# Patient Record
Sex: Female | Born: 1980 | Race: Black or African American | Hispanic: No | Smoking: Former smoker
Health system: Southern US, Community
[De-identification: ages and names within clinical notes are randomized; demographics above are authoritative.]

## PROBLEM LIST (undated history)

## (undated) DIAGNOSIS — F329 Major depressive disorder, single episode, unspecified: Secondary | ICD-10-CM

## (undated) DIAGNOSIS — F32A Depression, unspecified: Secondary | ICD-10-CM

## (undated) DIAGNOSIS — B009 Herpesviral infection, unspecified: Secondary | ICD-10-CM

## (undated) DIAGNOSIS — F909 Attention-deficit hyperactivity disorder, unspecified type: Secondary | ICD-10-CM

## (undated) DIAGNOSIS — J45909 Unspecified asthma, uncomplicated: Secondary | ICD-10-CM

## (undated) DIAGNOSIS — I341 Nonrheumatic mitral (valve) prolapse: Secondary | ICD-10-CM

## (undated) HISTORY — PX: OTHER SURGICAL HISTORY: SHX169

## (undated) HISTORY — DX: Unspecified asthma, uncomplicated: J45.909

## (undated) HISTORY — PX: CYSTECTOMY: SUR359

## (undated) HISTORY — DX: Nonrheumatic mitral (valve) prolapse: I34.1

---

## 2006-11-29 ENCOUNTER — Ambulatory Visit: Payer: Self-pay | Admitting: Obstetrics and Gynecology

## 2006-11-29 ENCOUNTER — Inpatient Hospital Stay (HOSPITAL_COMMUNITY): Admission: AD | Admit: 2006-11-29 | Discharge: 2006-11-29 | Payer: Self-pay | Admitting: Obstetrics & Gynecology

## 2006-12-14 ENCOUNTER — Ambulatory Visit: Payer: Self-pay | Admitting: Obstetrics and Gynecology

## 2006-12-14 ENCOUNTER — Inpatient Hospital Stay (HOSPITAL_COMMUNITY): Admission: AD | Admit: 2006-12-14 | Discharge: 2006-12-14 | Payer: Self-pay | Admitting: Family Medicine

## 2006-12-26 ENCOUNTER — Emergency Department (HOSPITAL_COMMUNITY): Admission: EM | Admit: 2006-12-26 | Discharge: 2006-12-26 | Payer: Self-pay | Admitting: Emergency Medicine

## 2007-01-01 ENCOUNTER — Inpatient Hospital Stay (HOSPITAL_COMMUNITY): Admission: AD | Admit: 2007-01-01 | Discharge: 2007-01-01 | Payer: Self-pay | Admitting: Obstetrics & Gynecology

## 2007-01-01 ENCOUNTER — Ambulatory Visit: Payer: Self-pay | Admitting: Obstetrics and Gynecology

## 2007-01-19 ENCOUNTER — Inpatient Hospital Stay (HOSPITAL_COMMUNITY): Admission: AD | Admit: 2007-01-19 | Discharge: 2007-01-19 | Payer: Self-pay | Admitting: Obstetrics & Gynecology

## 2007-01-19 ENCOUNTER — Ambulatory Visit: Payer: Self-pay | Admitting: Obstetrics and Gynecology

## 2007-01-24 ENCOUNTER — Ambulatory Visit: Payer: Self-pay | Admitting: Family Medicine

## 2007-01-24 ENCOUNTER — Inpatient Hospital Stay (HOSPITAL_COMMUNITY): Admission: AD | Admit: 2007-01-24 | Discharge: 2007-01-26 | Payer: Self-pay | Admitting: Family Medicine

## 2007-03-25 ENCOUNTER — Ambulatory Visit: Payer: Self-pay | Admitting: Obstetrics and Gynecology

## 2007-07-18 ENCOUNTER — Inpatient Hospital Stay (HOSPITAL_COMMUNITY): Admission: AD | Admit: 2007-07-18 | Discharge: 2007-07-18 | Payer: Self-pay | Admitting: Obstetrics & Gynecology

## 2007-07-20 ENCOUNTER — Inpatient Hospital Stay (HOSPITAL_COMMUNITY): Admission: AD | Admit: 2007-07-20 | Discharge: 2007-07-20 | Payer: Self-pay | Admitting: Obstetrics & Gynecology

## 2007-07-29 ENCOUNTER — Inpatient Hospital Stay (HOSPITAL_COMMUNITY): Admission: AD | Admit: 2007-07-29 | Discharge: 2007-07-29 | Payer: Self-pay | Admitting: Obstetrics & Gynecology

## 2007-09-03 ENCOUNTER — Emergency Department (HOSPITAL_COMMUNITY): Admission: EM | Admit: 2007-09-03 | Discharge: 2007-09-03 | Payer: Self-pay | Admitting: Emergency Medicine

## 2007-11-14 ENCOUNTER — Inpatient Hospital Stay (HOSPITAL_COMMUNITY): Admission: AD | Admit: 2007-11-14 | Discharge: 2007-11-14 | Payer: Self-pay | Admitting: Obstetrics & Gynecology

## 2008-10-28 DIAGNOSIS — I341 Nonrheumatic mitral (valve) prolapse: Secondary | ICD-10-CM | POA: Insufficient documentation

## 2008-10-28 HISTORY — DX: Nonrheumatic mitral (valve) prolapse: I34.1

## 2009-07-07 ENCOUNTER — Emergency Department (HOSPITAL_COMMUNITY): Admission: EM | Admit: 2009-07-07 | Discharge: 2009-07-07 | Payer: Self-pay | Admitting: Emergency Medicine

## 2009-10-16 DIAGNOSIS — A6000 Herpesviral infection of urogenital system, unspecified: Secondary | ICD-10-CM

## 2009-10-16 HISTORY — DX: Herpesviral infection of urogenital system, unspecified: A60.00

## 2010-05-07 ENCOUNTER — Emergency Department (HOSPITAL_COMMUNITY): Admission: EM | Admit: 2010-05-07 | Discharge: 2010-05-07 | Payer: Self-pay | Admitting: Family Medicine

## 2010-10-15 DIAGNOSIS — E559 Vitamin D deficiency, unspecified: Secondary | ICD-10-CM

## 2010-10-15 HISTORY — DX: Vitamin D deficiency, unspecified: E55.9

## 2010-11-18 ENCOUNTER — Encounter: Payer: Self-pay | Admitting: Orthopedic Surgery

## 2010-12-21 ENCOUNTER — Inpatient Hospital Stay (INDEPENDENT_AMBULATORY_CARE_PROVIDER_SITE_OTHER)
Admission: RE | Admit: 2010-12-21 | Discharge: 2010-12-21 | Disposition: A | Discharge status: Home / Self Care | Payer: Medicaid Other | Source: Ambulatory Visit | Attending: Family Medicine | Admitting: Family Medicine

## 2010-12-21 DIAGNOSIS — K089 Disorder of teeth and supporting structures, unspecified: Secondary | ICD-10-CM

## 2011-01-23 DIAGNOSIS — J309 Allergic rhinitis, unspecified: Secondary | ICD-10-CM

## 2011-01-23 DIAGNOSIS — J45909 Unspecified asthma, uncomplicated: Secondary | ICD-10-CM | POA: Insufficient documentation

## 2011-01-23 HISTORY — DX: Allergic rhinitis, unspecified: J30.9

## 2011-02-01 LAB — URINE MICROSCOPIC-ADD ON

## 2011-02-01 LAB — URINALYSIS, ROUTINE W REFLEX MICROSCOPIC
Bilirubin Urine: NEGATIVE
Glucose, UA: NEGATIVE mg/dL
Ketones, ur: 15 mg/dL — AB
Nitrite: NEGATIVE
pH: 7 (ref 5.0–8.0)

## 2011-02-01 LAB — POCT PREGNANCY, URINE: Preg Test, Ur: NEGATIVE

## 2011-02-27 DIAGNOSIS — F331 Major depressive disorder, recurrent, moderate: Secondary | ICD-10-CM | POA: Insufficient documentation

## 2011-02-27 HISTORY — DX: Major depressive disorder, recurrent, moderate: F33.1

## 2011-03-12 NOTE — Group Therapy Note (Signed)
NAME:  Natalie Colon, Natalie Colon NO.:  [id number]   MEDICAL RECORD NO.:  [id number]          PATIENT TYPE:  WOC   LOCATION:  [facility]                   FACILITY:  [facility]   PHYSICIAN:  Argentina Donovan, MD        DATE OF BIRTH:  1980-11-07   DATE OF SERVICE:                                  CLINIC NOTE   The patient is a 30 year old gravida 6, para 2-0-4-2 with 2 miscarriages  and 2 voluntary interruptions of pregnancy and two spontaneous vaginal  deliveries who desires voluntary sterilization.  We have talked to her  about the risks involved in  laparoscopic sterilization and told her it  is about the same as it would be she had a laparotomy, that is injury to  bowel, urinary tract, hemorrhage, blood vessel damage, infection,  anesthetic complications are all a possibility, also that the procedure  is not guaranteed 100%, that there are some who get pregnant afterwards.  It falls in the 1-2% category and that it should be considered permanent  as reconstitution of the tubes is difficult to do and to obtain.  We  have also discussed the alternatives for her, especially the IUD at 11  and 6-year.  She said she is a single mother who has had six pregnancies  by the age of  50.  She never wants any more kids.  She wants to do  something else with her life.  She is in nursing school at the present  time. She has always been in good health.  She has no significant  medical problems.  She has had no surgery other than the abortions.   ALLERGIES:  She has  allergies to VICODIN and PENICILLIN, though she  does not know what her allergies are as she was told by her parents she  is allergic to these two things.   MEDICATIONS:  She takes no medication on a regular basis.   PHYSICAL EXAMINATION:  She weighs 166 pounds.  She is 5 feet 7 inches  tall.  Review of systems is negative with exception of the present  illness.  The patient does not smoke, drink or take illicit drugs.  HEENT:   Within normal limits.  NECK:  Supple with no dominant masses in the thyroid which is  symmetrical LUNGS:  Clear to auscultation and percussion .  HEART:  No murmur, normal sinus rhythm.  ABDOMEN:  Soft, flat, nontender.  No masses or organomegaly.  EXTREMITIES:  No edema.  No varices.  DTRs within normal limits.  External genitalia is normal.  BUS is within normal limits.  Vagina is  clean and well rugated.  Cervix clean and parous.  Uterus anterior,  normal size, shape, consistency.  The adnexa is normal.   IMPRESSION:  The patient in good physical health desires voluntary  sterilization by the laparoscopic method.           ______________________________  Argentina Donovan, MD     PR/MEDQ  D:  03/25/2007  T:  03/25/2007  Job:  [id number]

## 2011-04-22 ENCOUNTER — Other Ambulatory Visit: Payer: Self-pay | Admitting: Obstetrics & Gynecology

## 2011-04-22 DIAGNOSIS — N971 Female infertility of tubal origin: Secondary | ICD-10-CM

## 2011-04-25 ENCOUNTER — Ambulatory Visit (HOSPITAL_COMMUNITY)
Admission: RE | Admit: 2011-04-25 | Discharge: 2011-04-25 | Disposition: A | Discharge status: Home / Self Care | Payer: Medicaid Other | Source: Ambulatory Visit | Attending: Obstetrics & Gynecology | Admitting: Obstetrics & Gynecology

## 2011-04-25 DIAGNOSIS — Z3049 Encounter for surveillance of other contraceptives: Secondary | ICD-10-CM | POA: Insufficient documentation

## 2011-04-25 DIAGNOSIS — N971 Female infertility of tubal origin: Secondary | ICD-10-CM

## 2011-04-25 MED ORDER — IOHEXOL 300 MG/ML  SOLN: 10.0000 mL | Freq: Once | INTRAMUSCULAR | Status: AC | PRN

## 2011-07-18 LAB — WET PREP, GENITAL
Trich, Wet Prep: NONE SEEN
Yeast Wet Prep HPF POC: NONE SEEN

## 2011-07-18 LAB — GC/CHLAMYDIA PROBE AMP, GENITAL
Chlamydia, DNA Probe: NEGATIVE
GC Probe Amp, Genital: NEGATIVE

## 2011-07-18 LAB — URINALYSIS, ROUTINE W REFLEX MICROSCOPIC
Bilirubin Urine: NEGATIVE
Glucose, UA: NEGATIVE
Hgb urine dipstick: NEGATIVE
Ketones, ur: NEGATIVE
Nitrite: NEGATIVE
Protein, ur: NEGATIVE
Specific Gravity, Urine: >1.03 — ABNORMAL HIGH
Urobilinogen, UA: 0.2
pH: 6

## 2011-07-18 LAB — POCT PREGNANCY, URINE
Operator id: 28886
Preg Test, Ur: POSITIVE

## 2011-07-22 DIAGNOSIS — F988 Other specified behavioral and emotional disorders with onset usually occurring in childhood and adolescence: Secondary | ICD-10-CM | POA: Insufficient documentation

## 2011-07-22 HISTORY — DX: Other specified behavioral and emotional disorders with onset usually occurring in childhood and adolescence: F98.8

## 2011-08-08 LAB — URINE MICROSCOPIC-ADD ON: WBC, UA: NONE SEEN

## 2011-08-08 LAB — URINALYSIS, ROUTINE W REFLEX MICROSCOPIC
Glucose, UA: NEGATIVE
Ketones, ur: 15 — AB
Leukocytes, UA: NEGATIVE
Specific Gravity, Urine: 1.025
pH: 6

## 2011-08-08 LAB — WET PREP, GENITAL
Clue Cells Wet Prep HPF POC: NONE SEEN
Yeast Wet Prep HPF POC: NONE SEEN

## 2011-08-08 LAB — CBC
HCT: 36.2
Hemoglobin: 12
MCHC: 33.2
RDW: 12.4
WBC: 13.7 — ABNORMAL HIGH

## 2011-08-08 LAB — POCT PREGNANCY, URINE: Operator id: 12753

## 2011-08-08 LAB — GC/CHLAMYDIA PROBE AMP, GENITAL: Chlamydia, DNA Probe: NEGATIVE

## 2011-11-27 ENCOUNTER — Encounter (HOSPITAL_COMMUNITY): Payer: Self-pay

## 2011-11-27 ENCOUNTER — Emergency Department (INDEPENDENT_AMBULATORY_CARE_PROVIDER_SITE_OTHER)
Admission: EM | Admit: 2011-11-27 | Discharge: 2011-11-27 | Disposition: A | Discharge status: Home / Self Care | Payer: Medicaid Other | Source: Home / Self Care | Attending: Emergency Medicine | Admitting: Emergency Medicine

## 2011-11-27 DIAGNOSIS — L237 Allergic contact dermatitis due to plants, except food: Secondary | ICD-10-CM

## 2011-11-27 DIAGNOSIS — L255 Unspecified contact dermatitis due to plants, except food: Secondary | ICD-10-CM

## 2011-11-27 MED ORDER — BETAMETHASONE DIPROPIONATE 0.05 % EX OINT: TOPICAL_OINTMENT | Freq: Two times a day (BID) | CUTANEOUS | Status: DC

## 2011-11-27 MED ORDER — PREDNISONE 20 MG PO TABS: ORAL_TABLET | ORAL | Status: AC

## 2011-11-27 MED ORDER — HYDROXYZINE HCL 25 MG PO TABS: 25.0000 mg | ORAL_TABLET | Freq: Four times a day (QID) | ORAL | Status: DC | PRN

## 2011-11-27 MED ORDER — ZANFEL EX MISC: CUTANEOUS | Status: DC

## 2011-11-27 NOTE — ED Notes (Signed)
opharmacy called re: prescribtion not covered by medicaid - order triamcinolone oint per dr Alfonse Ras same dosag e

## 2011-11-27 NOTE — ED Provider Notes (Signed)
History     CSN: [id number]  Arrival date & time 11/27/11  7829   First MD Initiated Contact with Natalie Colon 11/27/11 1933      Chief Complaint  Natalie Colon presents with  . Poison Ivy    (Consider location/radiation/quality/duration/timing/severity/associated sxs/prior treatment) HPI Comments: Pt states she was clearing out brush in her yard 3 days ago, now with progressively worsening, itchy, erythematous vesicular lesions on face, right forearm. Called her landlord, who informed her that she was working in a [employer] affairs [employer].  No sensation of being bitten at night, no blood on bedclothes in am. No new lotions, soaps, detergents, medications. No other contacts with similar rash. No pets in the home. No N/V, CP, SOB, wheezing, abd pain. Using calamine and hydrocortisone OTC w/o relief.  ROS as noted in HPI. All other ROS negative.    Natalie Colon is a 31 y.o. female presenting with Poison Ivy. The history is provided by the Natalie Colon. No language interpreter was used.  Poison Natalie Colon This is a new problem. The problem has been gradually worsening. The symptoms are aggravated by nothing. The symptoms are relieved by nothing. Treatments tried: calamine, hydrocortisone OTC. The treatment provided no relief.    History reviewed. No pertinent past medical history.  History reviewed. No pertinent past surgical history.  History reviewed. No pertinent family history.  History  Substance Use Topics  . Smoking status: Never Smoker   . Smokeless tobacco: Not on file  . Alcohol Use: No    OB History    Grav Para Term Preterm Abortions TAB SAB Ect Mult Living                  Review of Systems  Allergies  Codeine; Penicillins; and Vicodin  Home Medications   Current Outpatient Rx  Name Route Sig Dispense Refill  . BETAMETHASONE DIPROPIONATE 0.05 % EX OINT Topical Apply topically 2 (two) times daily. 30 g 0  . HYDROXYZINE HCL 25 MG PO TABS Oral Take 1 tablet (25 mg total) by mouth  every 6 (six) hours as needed for itching. 20 tablet 0  . ZANFEL EX MISC  Apply per directions 30 g 0  . PREDNISONE 20 MG PO TABS  3 Tabs PO Days 1-3, then 2 tabs PO Days 4-6, then 1 tab PO Day 7-9, then Half Tab PO Day 10-12 20 tablet 0    BP 114/76  Pulse 58  Temp(Src) 98.2 F (36.8 C) (Oral)  Resp 20  SpO2 100%  LMP 11/13/2011  Physical Exam  Nursing note and vitals reviewed. Constitutional: She is oriented to person, place, and time. She appears well-developed and well-nourished. No distress.  HENT:  Head: Normocephalic and atraumatic.  Eyes: Conjunctivae and EOM are normal. Pupils are equal, round, and reactive to light.  Neck: Normal range of motion.  Cardiovascular: Normal rate, regular rhythm and normal heart sounds.   Pulmonary/Chest: Effort normal and breath sounds normal.  Abdominal: She exhibits no distension.  Musculoskeletal: Normal range of motion.  Neurological: She is alert and oriented to person, place, and time.  Skin: Skin is warm and dry. Rash noted.       Erythematous, vesicular and urticarial rash on forehead, around right eye, and on RUE.   Psychiatric: She has a normal mood and affect. Her behavior is normal. Judgment and thought content normal.    ED Course  Procedures (including critical care time)  Labs Reviewed - No data to display No results found.  1. Poison ivy dermatitis       MDM    Luiz Blare, MD 11/27/11 2308

## 2011-11-27 NOTE — ED Notes (Signed)
Rash ; I think it's poison ivy

## 2011-12-08 ENCOUNTER — Encounter (HOSPITAL_COMMUNITY): Payer: Self-pay | Admitting: Emergency Medicine

## 2011-12-08 ENCOUNTER — Emergency Department (HOSPITAL_COMMUNITY)
Admission: EM | Admit: 2011-12-08 | Discharge: 2011-12-08 | Disposition: A | Discharge status: Home / Self Care | Payer: Medicaid Other | Attending: Emergency Medicine | Admitting: Emergency Medicine

## 2011-12-08 DIAGNOSIS — S01502A Unspecified open wound of oral cavity, initial encounter: Secondary | ICD-10-CM | POA: Insufficient documentation

## 2011-12-08 DIAGNOSIS — IMO0002 Reserved for concepts with insufficient information to code with codable children: Secondary | ICD-10-CM | POA: Insufficient documentation

## 2011-12-08 DIAGNOSIS — F909 Attention-deficit hyperactivity disorder, unspecified type: Secondary | ICD-10-CM | POA: Insufficient documentation

## 2011-12-08 DIAGNOSIS — F329 Major depressive disorder, single episode, unspecified: Secondary | ICD-10-CM | POA: Insufficient documentation

## 2011-12-08 DIAGNOSIS — F3289 Other specified depressive episodes: Secondary | ICD-10-CM | POA: Insufficient documentation

## 2011-12-08 DIAGNOSIS — S01512A Laceration without foreign body of oral cavity, initial encounter: Secondary | ICD-10-CM

## 2011-12-08 DIAGNOSIS — Z79899 Other long term (current) drug therapy: Secondary | ICD-10-CM | POA: Insufficient documentation

## 2011-12-08 HISTORY — DX: Herpesviral infection, unspecified: B00.9

## 2011-12-08 HISTORY — DX: Depression, unspecified: F32.A

## 2011-12-08 HISTORY — DX: Major depressive disorder, single episode, unspecified: F32.9

## 2011-12-08 HISTORY — DX: Attention-deficit hyperactivity disorder, unspecified type: F90.9

## 2011-12-08 NOTE — ED Notes (Signed)
Pt states that she was installing a fence and the pole hit her lip. Pt states that this occurred around 1630. Pt states that she began bleeding and applied pressure. Pt attempted to apply steri strips but they would not stay. Pt's husband concerned the laceration may need stitches.

## 2011-12-08 NOTE — ED Notes (Signed)
C/o lower lip laceration since hitting it with a pole around 4pm.  Bleeding controlled.

## 2011-12-08 NOTE — ED Provider Notes (Signed)
History     CSN: [id number]  Arrival date & time 12/08/11  2027   First MD Initiated Contact with Patient 12/08/11 2229      Chief Complaint  Patient presents with  . Lip Laceration    (Consider location/radiation/quality/duration/timing/severity/associated sxs/prior treatment) HPI Comments: Patient had her lower lip against her teeth with a pole about 4:30 this afternoon while putting up a fence.  She has a small laceration to the inside of her lower lip.  No active bleeding.  She washed the area of with peroxide rinse  The history is provided by the patient.    Past Medical History  Diagnosis Date  . ADHD (attention deficit hyperactivity disorder)   . Depression   . Herpes     History reviewed. No pertinent past surgical history.  No family history on file.  History  Substance Use Topics  . Smoking status: Never Smoker   . Smokeless tobacco: Not on file  . Alcohol Use: Yes    OB History    Grav Para Term Preterm Abortions TAB SAB Ect Mult Living                  Review of Systems  Constitutional: Negative for fever.  HENT: Positive for mouth sores.   Skin: Positive for wound.  Neurological: Negative for dizziness.    Allergies  Latex; Penicillins; and Vicodin  Home Medications   Current Outpatient Rx  Name Route Sig Dispense Refill  . ATOMOXETINE HCL 40 MG PO CAPS Oral Take 40 mg by mouth daily.    . SERTRALINE HCL 50 MG PO TABS Oral Take 50 mg by mouth daily.    Marland Kitchen HYDROXYZINE HCL 25 MG PO TABS Oral Take 25 mg by mouth every 6 (six) hours as needed. For itching    . PREDNISONE 20 MG PO TABS  3 Tabs PO Days 1-3, then 2 tabs PO Days 4-6, then 1 tab PO Day 7-9, then Half Tab PO Day 10-12 20 tablet 0    BP 108/75  Pulse 72  Temp(Src) 97.9 F (36.6 C) (Oral)  Resp 18  SpO2 98%  LMP 12/07/2011  Physical Exam  Constitutional: She is oriented to person, place, and time. She appears well-developed and well-nourished.  HENT:       Inside lower lip in  front of the second left lower central incisor, superficial, 3 mm laceration with no active bleeding  Eyes: Pupils are equal, round, and reactive to light.  Neck: Normal range of motion.  Cardiovascular: Normal rate.   Pulmonary/Chest: Effort normal.  Musculoskeletal: Normal range of motion.  Neurological: She is alert and oriented to person, place, and time.  Skin: Skin is warm. No rash noted. No pallor.    ED Course  Procedures (including critical care time)  Labs Reviewed - No data to display No results found.   1. Laceration of oral cavity       MDM  Fecal mucosa laceration        Arman Filter, NP 12/08/11 2242  Arman Filter, NP 12/08/11 2243

## 2011-12-09 NOTE — ED Provider Notes (Signed)
Medical screening examination/treatment/procedure(s) were performed by non-physician practitioner and as supervising physician I was immediately available for consultation/collaboration.   Mayia Megill M Danicka Hourihan, DO 12/09/11 0059 

## 2012-04-23 ENCOUNTER — Emergency Department (HOSPITAL_COMMUNITY): Payer: Medicaid Other

## 2012-04-23 ENCOUNTER — Encounter (HOSPITAL_COMMUNITY): Payer: Self-pay | Admitting: Emergency Medicine

## 2012-04-23 ENCOUNTER — Emergency Department (HOSPITAL_COMMUNITY)
Admission: EM | Admit: 2012-04-23 | Discharge: 2012-04-23 | Disposition: A | Discharge status: Home / Self Care | Payer: Medicaid Other | Attending: Emergency Medicine | Admitting: Emergency Medicine

## 2012-04-23 DIAGNOSIS — R079 Chest pain, unspecified: Secondary | ICD-10-CM | POA: Insufficient documentation

## 2012-04-23 DIAGNOSIS — R011 Cardiac murmur, unspecified: Secondary | ICD-10-CM | POA: Insufficient documentation

## 2012-04-23 LAB — BASIC METABOLIC PANEL
BUN: 11 mg/dL (ref 6–23)
Calcium: 9.2 mg/dL (ref 8.4–10.5)
GFR calc Af Amer: >90 mL/min (ref >90)
GFR calc non Af Amer: 87 mL/min — ABNORMAL LOW (ref >90)
Potassium: 3.6 mEq/L (ref 3.5–5.1)
Sodium: 140 mEq/L (ref 135–145)

## 2012-04-23 LAB — CBC
HCT: 38.7 % (ref 36.0–46.0)
MCH: 25.5 pg — ABNORMAL LOW (ref 26.0–34.0)
MCHC: 32.3 g/dL (ref 30.0–36.0)
RDW: 12.5 % (ref 11.5–15.5)

## 2012-04-23 NOTE — ED Notes (Addendum)
Pt c/o CP- described as sharp/stabbing - pain going down L side of arm; headaches, and blurred vision & dizziness, denies SOB; symptoms have been going on consistently for past 2 weeks; pt reports recently starting Zoloft 2 weeks ago; saw PCP Dr Isabella Stalling and was told she had abnormal EKG;

## 2012-04-23 NOTE — Discharge Instructions (Signed)
Read instructions below for reasons to return to the Emergency Department. It is recommended that your follow up with your Primary Care Doctor in regards to today's visit. If you do not have a doctor, use the resource guide listed below to help you find one. Begin taking over the counter Prilosec or Zegrid as directed.   Chest Pain (Nonspecific)  HOME CARE INSTRUCTIONS  For the next few days, avoid physical activities that bring on chest pain. Continue physical activities as directed.  Do not smoke cigarettes or drink alcohol until your symptoms are gone.  Only take over-the-counter or prescription medicine for pain, discomfort, or fever as directed by your caregiver.  Follow your caregiver's suggestions for further testing if your chest pain does not go away.  Keep any follow-up appointments you made. If you do not go to an appointment, you could develop lasting (chronic) problems with pain. If there is any problem keeping an appointment, you must call to reschedule.  SEEK MEDICAL CARE IF:  You think you are having problems from the medicine you are taking. Read your medicine instructions carefully.  Your chest pain does not go away, even after treatment.  You develop a rash with blisters on your chest.  SEEK IMMEDIATE MEDICAL CARE IF:  You have increased chest pain or pain that spreads to your arm, neck, jaw, back, or belly (abdomen).  You develop shortness of breath, an increasing cough, or you are coughing up blood.  You have severe back or abdominal pain, feel sick to your stomach (nauseous) or throw up (vomit).  You develop severe weakness, fainting, or chills.  You have an oral temperature above 102 F (38.9 C), not controlled by medicine.   THIS IS AN EMERGENCY. Do not wait to see if the pain will go away. Get medical help at once. Call your local emergency services (911 in [place] Do not drive yourself to the hospital.   RESOURCE GUIDE  Dental Problems  Patients with  Medicaid: [facility]                     [facility] [address]                                           [address] Phone:  [phone]                                                  Phone:  [phone]  If unable to pay or uninsured, contact:  [facility] or [facility] to become qualified for the adult dental clinic.  Chronic Pain Problems Contact [facility]  [phone] Patients need to be referred by their primary care doctor.  Insufficient Money for Medicine Contact [employer]:  call "211" or [facility] 271-5999.  No Primary Care Doctor Call [facility]  832-8000 Other agencies that provide inexpensive medical care    [facility]  832-8035    [facility]  [phone]    [facility]  [phone]    [facility] Clinic  832-4777    [facility]  [phone]    [facility]  [phone]  Psychological Services   [facility]  [phone] [facility]  [phone] [facility]   800 853-5163 (emergency services 641-4993)  Substance Abuse Resources [facility]  336-882-2125 [facility] 336-784-9470 [facility] 336-285-9073 [facility] 336-845-3988 Residential & Outpatient Substance Abuse Program  800-659-3381  Abuse/Neglect [place] Child Abuse Hotline (336) 641-3795 [place] Child Abuse Hotline 800-378-5315 (After Hours)  Emergency Shelter  [facility] (336) 271-5985  [facility] (336) 275-9566 [facility] (704) 372-4663  MRSA Hotline #:   832-7006    [place] Resources  [facility] [place] [facility]     [employer]                          [facility] [address] [place]                       [address]      [address]  [place]                                                 [place]                            [place] Phone:  [phone]                                   Phone:  [phone]                 Phone:  [phone]  [facility] Phone:  [phone]  [place] Child Abuse Hotline (336) 342-1394 (336) 342-3537 (After Hours)   

## 2012-04-23 NOTE — ED Provider Notes (Signed)
Medical screening examination/treatment/procedure(s) were performed by non-physician practitioner and as supervising physician I was immediately available for consultation/collaboration.   Lanita Stammen, MD 04/23/12 2124 

## 2012-04-23 NOTE — ED Provider Notes (Signed)
History     CSN: [id number]  Arrival date & time 04/23/12  1531   First MD Initiated Contact with Patient 04/23/12 1805      Chief Complaint  Patient presents with  . Chest Pain    (Consider location/radiation/quality/duration/timing/severity/associated sxs/prior treatment) HPI Comments: Patient is a 31 yo with a history of mitral valve prolapse presents emergency department with a chief complaint of chest pain.  Onset of symptoms began about 2 weeks ago, is located in the left side of chest with radiation to both the left and right arm, is described as a sharp feeling that intermittent lasting about 15-30 seconds and reoccurring 2 times a day about every other day.  Patient denies associated symptoms including shortness of breath, dyspnea on exertion, PND, orthopnea, leg swelling, palpitations, syncope, fever, night sweats, chills, cough, hemoptysis, estrogen use, smoking, recent travel or surgery.  Patient states that she's not been evaluated by her cardiologist for the last 4 years but had one in [place] with a normal 2-D echo and Holter monitor.  Patient did not have a family history of early cardiac disease.  Patient is a 31 y.o. female presenting with chest pain. The history is provided by the patient.  Chest Pain     Past Medical History  Diagnosis Date  . ADHD (attention deficit hyperactivity disorder)   . Depression   . Herpes     Past Surgical History  Procedure Date  . Cystectomy     History reviewed. No pertinent family history.  History  Substance Use Topics  . Smoking status: Former Smoker    Quit date: 11/23/2010  . Smokeless tobacco: Not on file  . Alcohol Use: Yes     occasion    OB History    Grav Para Term Preterm Abortions TAB SAB Ect Mult Living                  Review of Systems  Cardiovascular: Positive for chest pain.  All other systems reviewed and are negative.    Allergies  Latex; Penicillins; and Vicodin  Home Medications    Current Outpatient Rx  Name Route Sig Dispense Refill  . ATOMOXETINE HCL 40 MG PO CAPS Oral Take 40 mg by mouth daily.    . SERTRALINE HCL 50 MG PO TABS Oral Take 50 mg by mouth daily.      BP 107/76  Pulse 78  Temp 99.3 F (37.4 C) (Oral)  Resp 15  SpO2 100%  LMP 04/12/2012  Physical Exam  Nursing note and vitals reviewed. Constitutional: She appears well-developed and well-nourished. No distress.  HENT:  Head: Normocephalic and atraumatic.  Eyes: Conjunctivae and EOM are normal. Pupils are equal, round, and reactive to light.  Neck: Normal range of motion. Neck supple. Normal carotid pulses and no JVD present. Carotid bruit is not present. No rigidity. Normal range of motion present.  Cardiovascular: Normal rate, regular rhythm, S1 normal, S2 normal, intact distal pulses and normal pulses.  Exam reveals no gallop and no friction rub.   Murmur heard.  Systolic murmur is present with a grade of 2/6       No pitting edema bilaterally, RRR, no aberrant sounds on auscultations, distal pulses intact, no carotid bruit or JVD.   Pulmonary/Chest: Effort normal and breath sounds normal. No accessory muscle usage or stridor. No respiratory distress. She exhibits no tenderness and no bony tenderness.  Abdominal: Bowel sounds are normal.       Soft non tender.  Non pulsatile aorta.   Skin: Skin is warm, dry and intact. No rash noted. She is not diaphoretic. No cyanosis. Nails show no clubbing.    ED Course  Procedures (including critical care time)  Labs Reviewed  CBC - Abnormal; Notable for the following:    MCH 25.5 (*)     All other components within normal limits  BASIC METABOLIC PANEL - Abnormal; Notable for the following:    GFR calc non Af Amer 87 (*)     All other components within normal limits  POCT I-STAT TROPONIN I   No results found.   No diagnosis found.   Date: 04/23/2012  Rate: 96  Rhythm: normal sinus rhythm  QRS Axis: normal  Intervals: normal  ST/T Wave  abnormalities: nonspecific T wave changes  Conduction Disutrbances:none  Narrative Interpretation:   Old EKG Reviewed: none available     MDM  CP  Imaging reviewed. Patient is to be discharged with recommendation to follow up with PCP in regards to today's hospital visit. Chest pain is not likely of cardiac or pulmonary etiology d/t presentation, perc negative, VSS, no tracheal deviation, no JVD or new murmur, RRR, breath sounds equal bilaterally, EKG without acute abnormalities. Pt has been advised to also establish a relationship with a cardiologist here in town to asses her MVP & to return to the ED is CP becomes exertional, associated with diaphoresis or nausea, radiates to left jaw/arm, worsens or becomes concerning in any way. Pt appears reliable for follow up and is agreeable to discharge.   Case has been discussed with and seen by Dr. Ranae Palms who agrees with the above plan to discharge.          Jaci Carrel, [place] 04/23/12 2005

## 2012-05-20 ENCOUNTER — Encounter: Payer: Self-pay | Admitting: Cardiovascular Disease

## 2012-05-20 ENCOUNTER — Ambulatory Visit (INDEPENDENT_AMBULATORY_CARE_PROVIDER_SITE_OTHER): Payer: Medicaid Other | Admitting: Cardiovascular Disease

## 2012-05-20 VITALS — BP 103/77 | Ht 67.0 in | Wt 175.0 lb

## 2012-05-20 DIAGNOSIS — R079 Chest pain, unspecified: Secondary | ICD-10-CM

## 2012-05-20 HISTORY — DX: Chest pain, unspecified: R07.9

## 2012-05-20 NOTE — Assessment & Plan Note (Signed)
Atypical chest pain. Will arrange treadmill exercise stress test with EKG abnormality. Will also get echo to assess LVEF and valves.

## 2012-05-20 NOTE — Patient Instructions (Addendum)
Your physician has requested that you have an exercise tolerance test. For further information please visit [email or web address] Please also follow instruction sheet, as given.  Your physician has requested that you have an echocardiogram. Echocardiography is a painless test that uses sound waves to create images of your heart. It provides your doctor with information about the size and shape of your heart and how well your heart's chambers and valves are working. This procedure takes approximately one hour. There are no restrictions for this procedure.  Your physician recommends that you schedule a follow-up appointment in: 6 weeks

## 2012-05-20 NOTE — Progress Notes (Signed)
   History of Present Illness: 31 yo AAF with history of Mitral valve prolapse, ADHD, depression who is here today as a new patient for evaluation of chest pain. She was seen in the ED at [facility] on 04/23/12 for evaluation of chest pain. EKG 04/23/12 with inverted T waves inferior leads, poor R wave progression through the precordial leads, NSR. Troponin negative. Labs unremarkable.   She tells me that she has occasional chest pains. This is usually substernal. This occurs at rest or with exertion. She has radiation of pain into her left arm. This happens several times per month. There is associated diaphoresis and headaches. She reports dizziness, fatigue and insomnia. She reports that she had an echo in [place] 3-4 years ago that showed mitral valve disease. She also had a treadmill stress test that was normal and she wore a monitor and does not remember any issues. No other complaints.   Primary Care Physician: Anette Riedel  Past Medical History  Diagnosis Date  . ADHD (attention deficit hyperactivity disorder)   . Depression   . Herpes   . Asthma   . Mitral valve prolapse 2010    Echo and workup in [place]    Past Surgical History  Procedure Date  . Cystectomy     Neck  . Pilonidal cyst removal   . Tubal implant procedure     Current Outpatient Prescriptions  Medication Sig Dispense Refill  . atomoxetine (STRATTERA) 40 MG capsule Take 40 mg by mouth daily. MED ON HOLD RIGHT NOW        Allergies  Allergen Reactions  . Latex Itching  . Penicillins Other (See Comments)    unknown  . Vicodin (Hydrocodone-Acetaminophen) Other (See Comments)    Heart racing    History   Social History  . Marital Status: Single    Spouse Name: N/A    Number of Children: 3  . 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 Smoking status: Former Smoker -- 0.2 packs/day for 10 years    Types: Cigarettes    Quit date: 11/23/2009   . Smokeless tobacco: Not on file  . Alcohol Use: 0.5 oz/week    1 drink(s) per week     occasion  . Drug Use: No  . Sexually Active: Not on file   Other Topics Concern  . Not on file   Social History Narrative  . No narrative on file    Family History  Problem Relation Age of Onset  . Heart attack Maternal Grandmother     Review of Systems:  As stated in the HPI and otherwise negative.   BP 103/77  Ht 5\' 7"  (1.702 m)  Wt 175 lb (79.379 kg)  BMI 27.41 kg/m2  LMP 04/12/2012  Physical Examination: General: Well developed, well nourished, NAD HEENT: OP clear, mucus membranes moist SKIN: warm, dry. No rashes. Neuro: No focal deficits Musculoskeletal: Muscle strength 5/5 all ext Psychiatric: Mood and affect normal Neck: No JVD, no carotid bruits, no thyromegaly, no lymphadenopathy. Lungs:Clear bilaterally, no wheezes, rhonci, crackles Cardiovascular: Regular rate and rhythm. No murmurs, gallops or rubs. Abdomen:Soft. Bowel sounds present. Non-tender.  Extremities: No lower extremity edema. Pulses are 2 + in the bilateral DP/PT.  EKG: NSR, rate 63 bpm. Poor R wave progression through the precordial leads. T-wave abnormality inferior leads.

## 2012-05-26 ENCOUNTER — Other Ambulatory Visit (HOSPITAL_COMMUNITY): Payer: Medicaid Other

## 2012-06-04 ENCOUNTER — Ambulatory Visit (INDEPENDENT_AMBULATORY_CARE_PROVIDER_SITE_OTHER): Payer: Medicaid Other | Admitting: Physician Assistant

## 2012-06-04 ENCOUNTER — Encounter: Payer: Self-pay | Admitting: Physician Assistant

## 2012-06-04 DIAGNOSIS — R079 Chest pain, unspecified: Secondary | ICD-10-CM

## 2012-06-04 NOTE — Procedures (Signed)
Exercise Treadmill Test  Pre-Exercise Testing Evaluation Rhythm: normal sinus  Rate: 66   PR:  .12 QRS:  .08  QT:  .42 QTc: .44     Test  Exercise Tolerance Test Ordering MD: Melene Muller, MD  Interpreting MD: Tereso Newcomer , PA-C  Unique Test No: 1  Treadmill:  1  Indication for ETT: chest pain - rule out ischemia  Contraindication to ETT: No   Stress Modality: exercise - treadmill  Cardiac Imaging Performed: non   Protocol: standard Bruce - maximal  Max BP: 130/64  Max MPHR (bpm):  191 85% MPR (bpm):  162  MPHR obtained (bpm):  166 % MPHR obtained:  88%  Reached 85% MPHR (min:sec):  5:35 Total Exercise Time (min-sec):  69:34  Workload in METS:  7.8 Borg Scale: 13  Reason ETT Terminated:  patient's desire to stop    ST Segment Analysis At Rest: normal ST segments - no evidence of significant ST depression With Exercise: non-specific ST changes  Other Information Arrhythmia:  No Angina during ETT:  absent (0) Quality of ETT:  diagnostic  ETT Interpretation:  normal - no evidence of ischemia by ST analysis  Comments: Fair exercise tolerance. No chest pain. Normal BP response to exercise. Increased artifact somewhat limit interpretation. However, no significant ST-T changes to suggest ischemia.   Recommendations: Follow up with Dr. Verne Carrow as directed. Tereso Newcomer, PA-C  12:53 PM 06/04/2012

## 2012-06-24 ENCOUNTER — Ambulatory Visit (HOSPITAL_COMMUNITY): Payer: Medicaid Other | Attending: Cardiology | Admitting: Radiology

## 2012-06-24 DIAGNOSIS — I059 Rheumatic mitral valve disease, unspecified: Secondary | ICD-10-CM | POA: Insufficient documentation

## 2012-06-24 DIAGNOSIS — R079 Chest pain, unspecified: Secondary | ICD-10-CM

## 2012-06-24 DIAGNOSIS — Z87891 Personal history of nicotine dependence: Secondary | ICD-10-CM | POA: Insufficient documentation

## 2012-06-24 DIAGNOSIS — I079 Rheumatic tricuspid valve disease, unspecified: Secondary | ICD-10-CM | POA: Insufficient documentation

## 2012-06-24 DIAGNOSIS — R072 Precordial pain: Secondary | ICD-10-CM

## 2012-06-24 NOTE — Progress Notes (Signed)
Echocardiogram performed.  

## 2012-06-25 ENCOUNTER — Telehealth: Payer: Self-pay | Admitting: Cardiovascular Disease

## 2012-06-25 NOTE — Telephone Encounter (Signed)
Results of ECHO given to patient 

## 2012-06-25 NOTE — Telephone Encounter (Signed)
Pt rtning call to [facility] from yesterday

## 2012-07-03 ENCOUNTER — Encounter: Payer: Self-pay | Admitting: Cardiovascular Disease

## 2012-07-03 ENCOUNTER — Ambulatory Visit (INDEPENDENT_AMBULATORY_CARE_PROVIDER_SITE_OTHER): Payer: Medicaid Other | Admitting: Cardiovascular Disease

## 2012-07-03 VITALS — BP 110/70 | HR 59 | Ht 67.0 in | Wt 175.8 lb

## 2012-07-03 DIAGNOSIS — I341 Nonrheumatic mitral (valve) prolapse: Secondary | ICD-10-CM

## 2012-07-03 DIAGNOSIS — I059 Rheumatic mitral valve disease, unspecified: Secondary | ICD-10-CM

## 2012-07-03 NOTE — Patient Instructions (Addendum)
Your physician wants you to follow-up in:  2 years. You will receive a reminder letter in the mail two months in advance. If you don't receive a letter, please call our office to schedule the follow-up appointment.   

## 2012-07-03 NOTE — Progress Notes (Signed)
History of Present Illness: 31 yo AAF with history of Mitral valve prolapse, ADHD, depression who is here today for cardiac follow up. I saw her as a new patient for evaluation of chest pain on 05/20/12. She was seen in the ED at [facility] on 04/23/12 with c/o chest pain. EKG 04/23/12 with inverted T waves inferior leads, poor R wave progression through the precordial leads, NSR. Troponin negative. Labs unremarkable. She told me that she has occasional chest pains. This is usually substernal. This occurs at rest or with exertion. She has radiation of pain into her left arm. This happens several times per month. There is associated diaphoresis and headaches. She reports dizziness, fatigue and insomnia. She reported that she had an echo in [place] 3-4 years ago that showed mitral valve disease. She also had a treadmill stress test that was normal and she wore a monitor and does not remember any issues. I arranged an exercise stress test and an echo. 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 She had no EKG changes c/w ischemia during exercise stress test.   She is here today for cardiac follow up. She is feeling better. No chest pain or SOB.   Primary Care Physician: Anette Riedel   Past Medical History  Diagnosis Date  . ADHD (attention deficit hyperactivity disorder)   . Depression   . Herpes   . Asthma   . Mitral valve prolapse 2010    Echo and workup in [place]    Past Surgical History  Procedure Date  . Cystectomy     Neck  . Pilonidal cyst removal   . Tubal implant procedure     Current Outpatient Prescriptions  Medication Sig Dispense Refill  . atomoxetine (STRATTERA) 40 MG capsule Take 40 mg by mouth daily. MED ON HOLD RIGHT NOW        Allergies  Allergen Reactions  . Latex Itching  . Penicillins Other (See Comments)    unknown  . Vicodin (Hydrocodone-Acetaminophen) Other (See  Comments)    Heart racing    History   Social History  . Marital Status: Single    Spouse Name: N/A    Number of Children: 3  . 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 Smoking status: Former Smoker -- 0.2 packs/day for 10 years    Types: Cigarettes    Quit date: 11/23/2009  . Smokeless tobacco: Not on file  . Alcohol Use: 0.5 oz/week    1 drink(s) per week     occasion  . Drug Use: No  . Sexually Active: Not on file   Other Topics Concern  . Not on file   Social History Narrative  . No narrative on file    Family History  Problem Relation Age of Onset  . Heart attack Maternal Grandmother   . COPD Maternal Grandmother   . COPD Mother   . Hypertension Mother   . Heart disease Maternal Grandfather   . COPD Maternal Grandfather     Review of Systems:  As stated in the HPI and otherwise negative.   BP 110/70  Pulse 59  Ht 5\' 7"  (1.702 m)  Wt 175 lb 12.8 oz (79.742 kg)  BMI 27.53 kg/m2  SpO2 98%  Physical Examination: General: Well developed, well nourished, NAD HEENT: OP  clear, mucus membranes moist SKIN: warm, dry. No rashes. Neuro: No focal deficits Musculoskeletal: Muscle strength 5/5 all ext Psychiatric: Mood and affect normal Neck: No JVD, no carotid bruits, no thyromegaly, no lymphadenopathy. Lungs:Clear bilaterally, no wheezes, rhonci, crackles Cardiovascular: Regular rate and rhythm. Slight systolic murmur. No gallops or rubs. Abdomen:Soft. Bowel sounds present. Non-tender.  Extremities: No lower extremity edema. Pulses are 2 + in the bilateral DP/PT.  Echo 06/24/12:  Left ventricle: The cavity size was normal. Wall thickness was normal. Systolic function was normal. The estimated ejection fraction was in the range of 55% to 60%. Wall motion was normal; there were no regional wall motion abnormalities. Left ventricular diastolic function parameters were normal. - Mitral  valve: Mild prolapse, involving the posterior leaflet. Mild regurgitation. - Atrial septum: There was an atrial septal aneurysm.   Assessment and Plan:   1. Chest pain: Atypical chest pain. Treadmill exercise stress test without evidence of ischemia. Echo with normal LV function. Non-cardiac pain.   2. Mitral valve prolapse: She has mild MR with prolapse of posterior leaflet. Repeat echo 2 years.

## 2013-06-11 ENCOUNTER — Encounter (HOSPITAL_COMMUNITY): Payer: Self-pay | Admitting: Emergency Medicine

## 2013-06-11 ENCOUNTER — Emergency Department (HOSPITAL_COMMUNITY)
Admission: EM | Admit: 2013-06-11 | Discharge: 2013-06-11 | Disposition: A | Discharge status: Home / Self Care | Payer: Medicaid Other | Source: Home / Self Care

## 2013-06-11 DIAGNOSIS — J069 Acute upper respiratory infection, unspecified: Secondary | ICD-10-CM

## 2013-06-11 MED ORDER — FEXOFENADINE HCL 180 MG PO TABS: 180.0000 mg | ORAL_TABLET | Freq: Every day | ORAL | Status: DC

## 2013-06-11 MED ORDER — AZITHROMYCIN 250 MG PO TABS: ORAL_TABLET | ORAL | Status: DC

## 2013-06-11 NOTE — ED Notes (Signed)
Sinus pressure and pain with itchy watery eyes and sore throat since yesterday.  Denies fever n/v/d. Pt has been taking mucinex, increasing fluids and rest with no relief in symptoms.

## 2013-06-11 NOTE — ED Provider Notes (Signed)
CSN: [id number]     Arrival date & time 06/11/13  1115 History     None    Chief Complaint  Patient presents with  . URI   (Consider location/radiation/quality/duration/timing/severity/associated sxs/prior Treatment) Patient is a 32 y.o. female presenting with URI. The history is provided by the patient.  URI Presenting symptoms: congestion, cough and rhinorrhea   Presenting symptoms: no ear pain   Severity:  Mild Duration:  1 day Progression:  Unchanged Chronicity:  New Risk factors: sick contacts   Risk factors comment:  Son with similar illness.   Past Medical History  Diagnosis Date  . ADHD (attention deficit hyperactivity disorder)   . Depression   . Herpes   . Asthma   . Mitral valve prolapse 2010    Echo and workup in [place]   Past Surgical History  Procedure Laterality Date  . Cystectomy      Neck  . Pilonidal cyst removal    . Tubal implant procedure     Family History  Problem Relation Age of Onset  . Heart attack Maternal Grandmother   . COPD Maternal Grandmother   . COPD Mother   . Hypertension Mother   . Heart disease Maternal Grandfather   . COPD Maternal Grandfather    History  Substance Use Topics  . Smoking status: Former Smoker -- 0.20 packs/day for 10 years    Types: Cigarettes    Quit date: 11/23/2009  . Smokeless tobacco: Not on file  . Alcohol Use: 0.5 oz/week    1 drink(s) per week     Comment: occasion   OB History   Grav Para Term Preterm Abortions TAB SAB Ect Mult Living                 Review of Systems  Constitutional: Negative.   HENT: Positive for congestion and rhinorrhea. Negative for ear pain and facial swelling.   Respiratory: Positive for cough.   Cardiovascular: Negative.     Allergies  Latex; Penicillins; and Vicodin  Home Medications   Current Outpatient Rx  Name  Route  Sig  Dispense  Refill  . amphetamine-dextroamphetamine (ADDERALL) 10 MG tablet   Oral   Take 10 mg by mouth daily.         .  valACYclovir (VALTREX) 500 MG tablet   Oral   Take 500 mg by mouth 2 (two) times daily.         Marland Kitchen azithromycin (ZITHROMAX Z-PAK) 250 MG tablet      Take as directed on pack   6 each   0   . fexofenadine (ALLEGRA) 180 MG tablet   Oral   Take 1 tablet (180 mg total) by mouth daily.   30 tablet   1    BP 101/67  Pulse 72  Temp(Src) 97.7 F (36.5 C) (Oral)  Resp 16  SpO2 99%  LMP 06/11/2013 Physical Exam  Nursing note and vitals reviewed. Constitutional: She is oriented to person, place, and time. She appears well-developed and well-nourished.  HENT:  Head: Normocephalic.  Right Ear: External ear normal.  Left Ear: External ear normal.  Nose: Mucosal edema and rhinorrhea present. Right sinus exhibits no maxillary sinus tenderness and no frontal sinus tenderness. Left sinus exhibits no maxillary sinus tenderness and no frontal sinus tenderness.  Mouth/Throat: Oropharynx is clear and moist.  Eyes: Conjunctivae are normal. Pupils are equal, round, and reactive to light.  Neck: Normal range of motion. Neck supple.  Neurological: She is alert  and oriented to person, place, and time.  Skin: Skin is warm and dry.    ED Course   Procedures (including critical care time)  Labs Reviewed - No data to display No results found. 1. URI (upper respiratory infection)     MDM    Linna Hoff, MD 06/11/13 1230

## 2013-06-14 ENCOUNTER — Encounter (HOSPITAL_COMMUNITY): Payer: Self-pay

## 2014-02-22 ENCOUNTER — Encounter (HOSPITAL_COMMUNITY): Payer: Self-pay | Admitting: Emergency Medicine

## 2014-02-22 ENCOUNTER — Emergency Department (INDEPENDENT_AMBULATORY_CARE_PROVIDER_SITE_OTHER): Payer: Medicaid Other

## 2014-02-22 ENCOUNTER — Emergency Department (HOSPITAL_COMMUNITY): Payer: Medicaid Other

## 2014-02-22 ENCOUNTER — Emergency Department (HOSPITAL_COMMUNITY)
Admission: EM | Admit: 2014-02-22 | Discharge: 2014-02-22 | Disposition: A | Discharge status: Home / Self Care | Payer: Medicaid Other | Source: Home / Self Care | Attending: Emergency Medicine | Admitting: Emergency Medicine

## 2014-02-22 DIAGNOSIS — R0789 Other chest pain: Secondary | ICD-10-CM

## 2014-02-22 LAB — D-DIMER, QUANTITATIVE: D-Dimer, Quant: <0.27 ug/mL-FEU (ref 0.00–0.48)

## 2014-02-22 MED ORDER — IBUPROFEN 800 MG PO TABS
ORAL_TABLET | ORAL | Status: AC
  Filled 2014-02-22: qty 1

## 2014-02-22 MED ORDER — DICLOFENAC SODIUM 75 MG PO TBEC: 75.0000 mg | DELAYED_RELEASE_TABLET | Freq: Two times a day (BID) | ORAL | Status: DC

## 2014-02-22 MED ORDER — CYCLOBENZAPRINE HCL 5 MG PO TABS: 5.0000 mg | ORAL_TABLET | Freq: Three times a day (TID) | ORAL | Status: DC | PRN

## 2014-02-22 MED ORDER — IBUPROFEN 800 MG PO TABS
800.0000 mg | ORAL_TABLET | Freq: Once | ORAL | Status: AC
  Administered 2014-02-22: 800 mg via ORAL

## 2014-02-22 NOTE — Discharge Instructions (Signed)

## 2014-02-22 NOTE — ED Notes (Addendum)
Pt. Refused lab work.  Dr. Lorenz CoasterKeller notified and talked to pt.  She agrees to doing an EKG only.  He said he may have to sign out AMA since she is refusing work up. Pt. later agreed to lab and CXR but did not want to wait for d-dimer.  Dr. Lorenz CoasterKeller said he would call her the result, but if was pos., she would have to go to the ED.

## 2014-02-22 NOTE — ED Notes (Signed)
Called to assess pt. on arrival for chest pain.  Saw her doctor yesterday and was told it was allergies.  He added Nasonex. C/o L ant. Chest pain that radiates around to back onset last night and continuous.  C/o tightness in her throat.  C/o feeling hot all night-afebrile now.  C/o feeling clammy last night.  No nausea or acute SOB.

## 2014-02-22 NOTE — ED Provider Notes (Signed)
Chief Complaint   Chief Complaint  Patient presents with  . Chest Pain    History of Present Illness    Natalie Colon is a 33 year old female who has had a two-day history of chest pain localized to the substernal area radiating around to the left side and into the back. The pain is worse with breathing, coughing, and moving. Is rated a 5-6/10 in intensity. It began last night while she was lying down. The patient has felt clammy and chilled. She saw her primary care physician yesterday who diagnosed allergies and increased her allergy medicine. She's had some congestion, slight cough, and chest tightness. She denies any fever, sore throat, wheezing, or shortness of breath. She's had no nausea or vomiting. No abdominal pain or indigestion. She has a history of mitral valve prolapse in the past. 2 years ago she had similar pain, had EKG done and was abnormal. She was sent over to the hospital where she had MI ruled out, then followup with a cardiologist with a stress EKG which was normal. Her EKG showed diffuse T-wave inversions in leads V2 through V6.  Review of Systems    Other than noted above, the patient denies any of the following symptoms. Systemic:  No fever or chills. Pulmonary:  No cough, wheezing, shortness of breath, sputum production, hemoptysis. Cardiac:  No palpitations, rapid heartbeat, dizziness, presyncope or syncope. GI:  No abdominal pain, heartburn, nausea, or vomiting. Ext:  No leg pain or swelling.  PMFSH    Past medical history, family history, social history, meds, and allergies were reviewed. She's allergic to penicillin, Vicodin, and latex. She takes fluconazole, Valtrex, fexofenadine, bupropion, Adderall, and culture.  Physical Exam     Vital signs:  BP 117/79  Pulse 72  Temp(Src) 98.2 F (36.8 C) (Oral)  Resp 16  SpO2 98%  LMP 02/03/2014 Gen:  Alert, oriented, in no distress, skin warm and dry. Eye:  PERRL, lids and conjunctivas normal.  Sclera  non-icteric. ENT:  Mucous membranes moist, pharynx clear. Neck:  Supple, no adenopathy or tenderness.  No JVD. Lungs:  Clear to auscultation, no wheezes, rales or rhonchi.  No respiratory distress. Heart:  Regular rhythm.  No gallops, murmers, clicks or rubs. Chest:  There is chest wall tenderness to palpation over the lower sternal area, the left submammary area, especially over the lateral chest wall. Abdomen:  Soft, nontender, no organomegaly or mass.  Bowel sounds normal.  No pulsatile abdominal mass or bruit. Ext:  No edema.  No calf tenderness and Homann's sign negative.  Pulses full and equal. Skin:  Warm and dry.  No rash.  Labs     Results for orders placed during the hospital encounter of 02/22/14  D-DIMER, QUANTITATIVE      Result Value Ref Range   D-Dimer, Quant <0.27  0.00 - 0.48 ug/mL-FEU     Radiology     Dg Chest 2 View  02/22/2014   CLINICAL DATA:  Chest pain for 2 days.  EXAM: CHEST  2 VIEW  COMPARISON:  Chest radiograph performed 04/23/2012  FINDINGS: The lungs are well-aerated and clear. There is no evidence of focal opacification, pleural effusion or pneumothorax.  The heart is normal in size; the mediastinal contour is within normal limits. No acute osseous abnormalities are seen.  IMPRESSION: No acute cardiopulmonary process seen.   Electronically Signed   By: Roanna RaiderJeffery  Chang M.D.   On: 02/22/2014 21:40   I reviewed the images independently and personally and concur with  the radiologist's findings.  EKG Results:  Date: 02/22/2014  Rate: 71  Rhythm: normal sinus rhythm  QRS Axis: normal  Intervals: normal  ST/T Wave abnormalities: normal  Conduction Disutrbances:none  Narrative Interpretation: Normal sinus rhythm, normal EKG.  Old EKG Reviewed: none available    Course in Urgent Care Center         The patient had to leave before the results of the chest x-ray came back to before the results of the d-dimer. I told her this was not her usual protocol,  therefore I would have to have her sign out AGAINST MEDICAL ADVICE which she did. I will call her back this evening to let her know the results of the chest x-ray and the d-dimer.                                                                                                                                                       Assessment     The encounter diagnosis was Musculoskeletal chest pain.  No evidence of cardiac or pulmonary disease.  Plan     1.  Meds:  The following meds were prescribed:   Discharge Medication List as of 02/22/2014  8:53 PM    START taking these medications   Details  cyclobenzaprine (FLEXERIL) 5 MG tablet Take 1 tablet (5 mg total) by mouth 3 (three) times daily as needed for muscle spasms., Starting 02/22/2014, Until Discontinued, Print    diclofenac (VOLTAREN) 75 MG EC tablet Take 1 tablet (75 mg total) by mouth 2 (two) times daily., Starting 02/22/2014, Until Discontinued, Print        2.  Patient Education/Counseling:  The patient was given appropriate handouts, self care instructions, and instructed in symptomatic relief.    3.  Follow up:  The patient was told to follow up here if no better in 3 to 4 days, or sooner if becoming worse in any way, and give an an some red flag symptoms such as worsening pain, shortness of breath, dizziness, or passing out which would prompt immediate return. Followup with her primary care physician within the next week.     Reuben Likesavid C Sharifah Champine, MD 02/22/14 2229

## 2014-06-23 ENCOUNTER — Ambulatory Visit: Payer: BC Managed Care – PPO | Admitting: Cardiovascular Disease

## 2014-06-23 ENCOUNTER — Encounter: Payer: Self-pay | Admitting: Cardiovascular Disease

## 2014-06-23 ENCOUNTER — Ambulatory Visit (INDEPENDENT_AMBULATORY_CARE_PROVIDER_SITE_OTHER): Payer: BC Managed Care – PPO | Admitting: Cardiovascular Disease

## 2014-06-23 VITALS — BP 100/68 | HR 72 | Ht 67.0 in | Wt 166.0 lb

## 2014-06-23 DIAGNOSIS — I059 Rheumatic mitral valve disease, unspecified: Secondary | ICD-10-CM

## 2014-06-23 DIAGNOSIS — I341 Nonrheumatic mitral (valve) prolapse: Secondary | ICD-10-CM

## 2014-06-23 DIAGNOSIS — R0789 Other chest pain: Secondary | ICD-10-CM

## 2014-06-23 NOTE — Patient Instructions (Signed)
Your physician wants you to follow-up in:  2 years. You will receive a reminder letter in the mail two months in advance. If you don't receive a letter, please call our office to schedule the follow-up appointment.  Your physician has requested that you have an echocardiogram. Echocardiography is a painless test that uses sound waves to create images of your heart. It provides your doctor with information about the size and shape of your heart and how well your heart's chambers and valves are working. This procedure takes approximately one hour. There are no restrictions for this procedure.   

## 2014-06-23 NOTE — Progress Notes (Signed)
History of Present Illness: 33 yo AAF with history of Mitral valve prolapse, ADHD, depression who is here today for cardiac follow up. I saw her as a new patient for evaluation of chest pain on 05/20/12. She had been seen in the ED at [facility] on 04/23/12 with c/o chest pain. Her EKG 04/23/12 showed inverted T waves inferior leads, poor R wave progression through the precordial leads, NSR. Troponin negative. At that time she described occasional chest pains with rest or exertion associated with diaphoresis, headaches, dizziness, fatigue and insomnia.  She also had a treadmill stress test that was normal and she wore a monitor and does not remember any issues. I arranged an exercise stress test and an echo. 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 She had no EKG changes c/w ischemia during exercise stress test. Seen in the ED at [facility] April 2015 with chest pain felt to be musculoskeletal. EKG was unchanged then.   She is here today for cardiac follow up. She is feeling better. No chest pain or SOB. She is very active.   Primary Care Physician: Nadyne Coombes  Past Medical History  Diagnosis Date  . ADHD (attention deficit hyperactivity disorder)   . Depression   . Herpes   . Asthma   . Mitral valve prolapse 2010    Echo and workup in [place]    Past Surgical History  Procedure Laterality Date  . Cystectomy      Neck  . Pilonidal cyst removal    . Tubal implant procedure      Current Outpatient Prescriptions  Medication Sig Dispense Refill  . fexofenadine (ALLEGRA) 180 MG tablet Take 1 tablet (180 mg total) by mouth daily.  30 tablet  1  . Lactobacillus-Inulin (CULTURELLE DIGESTIVE HEALTH PO) Take by mouth.      . mometasone (NASONEX) 50 MCG/ACT nasal spray Place 2 sprays into the nose daily.      . valACYclovir (VALTREX) 500 MG tablet Take 500 mg by mouth 2 (two) times daily.      .  Vortioxetine HBr (BRINTELLIX) 5 MG TABS Take by mouth daily.       No current facility-administered medications for this visit.    Allergies  Allergen Reactions  . Hydrocodone-Acetaminophen   . Latex Itching  . Penicillins Other (See Comments)    unknown  . Vicodin [Hydrocodone-Acetaminophen] Other (See Comments)    Heart racing    History   Social History  . Marital Status: Single    Spouse Name: N/A    Number of Children: 3  . 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 Smoking status: Current Some Day Smoker -- 0.20 packs/day for 10 years    Types: Cigarettes  . Smokeless tobacco: Not on file  . Alcohol Use: 0.5 oz/week    1 drink(s) per week     Comment: occasion  . Drug Use: No  . Sexual Activity: Yes    Birth Control/ Protection: Other-see comments   Other Topics Concern  . Not on file   Social History Narrative  . No narrative on file    Family History  Problem Relation Age of Onset  . Heart attack Maternal Grandmother   . COPD Maternal Grandmother   . COPD Mother   . Hypertension Mother   .  Heart disease Maternal Grandfather   . COPD Maternal Grandfather     Review of Systems:  As stated in the HPI and otherwise negative.   BP 100/68  Pulse 72  Ht  (1.702 m)  Wt 166 lb (75.297 kg)  BMI 25.99 kg/m2  SpO2 99%  Physical Examination: General: Well developed, well nourished, NAD HEENT: OP clear, mucus membranes moist SKIN: warm, dry. No rashes. Neuro: No focal deficits Musculoskeletal: Muscle strength 5/5 all ext Psychiatric: Mood and affect normal Neck: No JVD, no carotid bruits, no thyromegaly, no lymphadenopathy. Lungs:Clear bilaterally, no wheezes, rhonci, crackles Cardiovascular: Regular rate and rhythm. Slight systolic murmur. No gallops or rubs. Abdomen:Soft. Bowel sounds present. Non-tender.  Extremities: No lower extremity edema. Pulses are 2 + in the bilateral  DP/PT.  Echo 06/24/12:  Left ventricle: The cavity size was normal. Wall thickness was normal. Systolic function was normal. The estimated ejection fraction was in the range of 55% to 60%. Wall motion was normal; there were no regional wall motion abnormalities. Left ventricular diastolic function parameters were normal. - Mitral valve: Mild prolapse, involving the posterior leaflet. Mild regurgitation. - Atrial septum: There was an atrial septal aneurysm.  Assessment and Plan:   1. Chest pain: No recurrence since April 2015 when she was seen in the ED. Most likely musculoskeletal. Treadmill exercise stress test without evidence of ischemia in 2013. Echo with normal LV function in 2013.   2. Mitral valve prolapse: She has mild MR with prolapse of posterior leaflet by echo in 2013. Repeat echo now. I will see her back in 2 years.

## 2014-07-01 ENCOUNTER — Ambulatory Visit (HOSPITAL_COMMUNITY): Payer: BC Managed Care – PPO | Attending: Cardiovascular Disease | Admitting: Radiology

## 2014-07-01 DIAGNOSIS — R072 Precordial pain: Secondary | ICD-10-CM | POA: Insufficient documentation

## 2014-07-01 DIAGNOSIS — I341 Nonrheumatic mitral (valve) prolapse: Secondary | ICD-10-CM

## 2014-07-01 NOTE — Progress Notes (Signed)
Echocardiogram performed.  

## 2014-12-01 ENCOUNTER — Emergency Department (HOSPITAL_COMMUNITY)
Admission: EM | Admit: 2014-12-01 | Discharge: 2014-12-01 | Disposition: A | Discharge status: Home / Self Care | Payer: Medicaid Other | Attending: Emergency Medicine | Admitting: Emergency Medicine

## 2014-12-01 ENCOUNTER — Encounter (HOSPITAL_COMMUNITY): Payer: Self-pay | Admitting: Emergency Medicine

## 2014-12-01 DIAGNOSIS — Z79899 Other long term (current) drug therapy: Secondary | ICD-10-CM | POA: Diagnosis not present

## 2014-12-01 DIAGNOSIS — Z91040 Latex allergy status: Secondary | ICD-10-CM | POA: Insufficient documentation

## 2014-12-01 DIAGNOSIS — F329 Major depressive disorder, single episode, unspecified: Secondary | ICD-10-CM | POA: Insufficient documentation

## 2014-12-01 DIAGNOSIS — Z8619 Personal history of other infectious and parasitic diseases: Secondary | ICD-10-CM | POA: Insufficient documentation

## 2014-12-01 DIAGNOSIS — Z720 Tobacco use: Secondary | ICD-10-CM | POA: Diagnosis not present

## 2014-12-01 DIAGNOSIS — Z7951 Long term (current) use of inhaled steroids: Secondary | ICD-10-CM | POA: Insufficient documentation

## 2014-12-01 DIAGNOSIS — F419 Anxiety disorder, unspecified: Secondary | ICD-10-CM | POA: Insufficient documentation

## 2014-12-01 DIAGNOSIS — Z880 Allergy status to penicillin: Secondary | ICD-10-CM | POA: Insufficient documentation

## 2014-12-01 DIAGNOSIS — J45909 Unspecified asthma, uncomplicated: Secondary | ICD-10-CM | POA: Diagnosis not present

## 2014-12-01 DIAGNOSIS — Z8679 Personal history of other diseases of the circulatory system: Secondary | ICD-10-CM | POA: Diagnosis not present

## 2014-12-01 DIAGNOSIS — F439 Reaction to severe stress, unspecified: Secondary | ICD-10-CM

## 2014-12-01 DIAGNOSIS — R079 Chest pain, unspecified: Secondary | ICD-10-CM | POA: Diagnosis present

## 2014-12-01 LAB — CBC WITH DIFFERENTIAL/PLATELET
BASOS ABS: 0 10*3/uL (ref 0.0–0.1)
Basophils Relative: 0 % (ref 0–1)
EOS PCT: 2 % (ref 0–5)
Eosinophils Absolute: 0.1 10*3/uL (ref 0.0–0.7)
HEMATOCRIT: 41 % (ref 36.0–46.0)
Hemoglobin: 13.2 g/dL (ref 12.0–15.0)
LYMPHS ABS: 2.4 10*3/uL (ref 0.7–4.0)
LYMPHS PCT: 41 % (ref 12–46)
MCH: 27.5 pg (ref 26.0–34.0)
MCHC: 32.2 g/dL (ref 30.0–36.0)
MCV: 85.4 fL (ref 78.0–100.0)
Monocytes Absolute: 0.4 10*3/uL (ref 0.1–1.0)
Monocytes Relative: 6 % (ref 3–12)
NEUTROS ABS: 3 10*3/uL (ref 1.7–7.7)
NEUTROS PCT: 51 % (ref 43–77)
Platelets: 314 10*3/uL (ref 150–400)
RBC: 4.8 MIL/uL (ref 3.87–5.11)
RDW: 12.1 % (ref 11.5–15.5)
WBC: 5.9 10*3/uL (ref 4.0–10.5)

## 2014-12-01 LAB — I-STAT CHEM 8, ED
BUN: 10 mg/dL (ref 6–23)
Calcium, Ion: 1.24 mmol/L — ABNORMAL HIGH (ref 1.12–1.23)
Chloride: 83 mmol/L — ABNORMAL LOW (ref 96–112)
Creatinine, Ser: 0.7 mg/dL (ref 0.50–1.10)
Glucose, Bld: 94 mg/dL (ref 70–99)
HEMATOCRIT: 43 % (ref 36.0–46.0)
Hemoglobin: 14.6 g/dL (ref 12.0–15.0)
Potassium: 3.8 mmol/L (ref 3.5–5.1)
SODIUM: 141 mmol/L (ref 135–145)
TCO2: 24 mmol/L (ref 0–100)

## 2014-12-01 LAB — I-STAT TROPONIN, ED: TROPONIN I, POC: 0 ng/mL (ref 0.00–0.08)

## 2014-12-01 LAB — I-STAT CG4 LACTIC ACID, ED: LACTIC ACID, VENOUS: 0.78 mmol/L (ref 0.5–2.0)

## 2014-12-01 LAB — POC URINE PREG, ED: Preg Test, Ur: NEGATIVE

## 2014-12-01 NOTE — ED Notes (Signed)
PA at BS.  

## 2014-12-01 NOTE — Discharge Instructions (Signed)
Panic Attacks °Panic attacks are sudden, short-lived surges of severe anxiety, fear, or discomfort. They may occur for no reason when you are relaxed, when you are anxious, or when you are sleeping. Panic attacks may occur for a number of reasons:  °· Healthy people occasionally have panic attacks in extreme, life-threatening situations, such as war or natural disasters. Normal anxiety is a protective mechanism of the body that helps us react to danger (fight or flight response). °· Panic attacks are often seen with anxiety disorders, such as panic disorder, social anxiety disorder, generalized anxiety disorder, and phobias. Anxiety disorders cause excessive or uncontrollable anxiety. They may interfere with your relationships or other life activities. °· Panic attacks are sometimes seen with other mental illnesses, such as depression and posttraumatic stress disorder. °· Certain medical conditions, prescription medicines, and drugs of abuse can cause panic attacks. °SYMPTOMS  °Panic attacks start suddenly, peak within 20 minutes, and are accompanied by four or more of the following symptoms: °· Pounding heart or fast heart rate (palpitations). °· Sweating. °· Trembling or shaking. °· Shortness of breath or feeling smothered. °· Feeling choked. °· Chest pain or discomfort. °· Nausea or strange feeling in your stomach. °· Dizziness, light-headedness, or feeling like you will faint. °· Chills or hot flushes. °· Numbness or tingling in your lips or hands and feet. °· Feeling that things are not real or feeling that you are not yourself. °· Fear of losing control or going crazy. °· Fear of dying. °Some of these symptoms can mimic serious medical conditions. For example, you may think you are having a heart attack. Although panic attacks can be very scary, they are not life threatening. °DIAGNOSIS  °Panic attacks are diagnosed through an assessment by your health care provider. Your health care provider will ask  questions about your symptoms, such as where and when they occurred. Your health care provider will also ask about your medical history and use of alcohol and drugs, including prescription medicines. Your health care provider may order blood tests or other studies to rule out a serious medical condition. Your health care provider may refer you to a mental health professional for further evaluation. °TREATMENT  °· Most healthy people who have one or two panic attacks in an extreme, life-threatening situation will not require treatment. °· The treatment for panic attacks associated with anxiety disorders or other mental illness typically involves counseling with a mental health professional, medicine, or a combination of both. Your health care provider will help determine what treatment is best for you. °· Panic attacks due to physical illness usually go away with treatment of the illness. If prescription medicine is causing panic attacks, talk with your health care provider about stopping the medicine, decreasing the dose, or substituting another medicine. °· Panic attacks due to alcohol or drug abuse go away with abstinence. Some adults need professional help in order to stop drinking or using drugs. °HOME CARE INSTRUCTIONS  °· Take all medicines as directed by your health care provider.   °· Schedule and attend follow-up visits as directed by your health care provider. It is important to keep all your appointments. °SEEK MEDICAL CARE IF: °· You are not able to take your medicines as prescribed. °· Your symptoms do not improve or get worse. °SEEK IMMEDIATE MEDICAL CARE IF:  °· You experience panic attack symptoms that are different than your usual symptoms. °· You have serious thoughts about hurting yourself or others. °· You are taking medicine for panic attacks and   have a serious side effect. °MAKE SURE YOU: °· Understand these instructions. °· Will watch your condition. °· Will get help right away if you are not  doing well or get worse. °Document Released: 10/14/2005 Document Revised: 10/19/2013 Document Reviewed: 05/28/2013 °[employer]® Patient Information ©2015 [employer]. This information is not intended to replace advice given to you by your health care provider. Make sure you discuss any questions you have with your health care provider. °Stress °Stress-related medical problems are becoming increasingly common. °The body has a built-in physical response to stressful situations. Faced with pressure, challenge or danger, we need to react quickly. Our bodies release hormones such as cortisol and adrenaline to help do this. These hormones are part of the "fight or flight" response and affect the metabolic rate, heart rate and blood pressure, resulting in a heightened, stressed state that prepares the body for optimum performance in dealing with a stressful situation. °It is likely that early man required these mechanisms to stay alive, but usually modern stresses do not call for this, and the same hormones released in today's world can damage health and reduce coping ability. °CAUSES °· Pressure to perform at work, at school or in sports. °· Threats of physical violence. °· Money worries. °· Arguments. °· Family conflicts. °· Divorce or separation from significant other. °· Bereavement. °· New job or unemployment. °· Changes in location. °· Alcohol or drug abuse. °SOMETIMES, THERE IS NO PARTICULAR REASON FOR DEVELOPING STRESS. °Almost all people are at risk of being stressed at some time in their lives. It is important to know that some stress is temporary and some is long term. °· Temporary stress will go away when a situation is resolved. Most people can cope with short periods of stress, and it can often be relieved by relaxing, taking a walk or getting any type of exercise, chatting through issues with friends, or having a good night's sleep. °· Chronic (long-term, continuous) stress is much harder to deal with. It  can be psychologically and emotionally damaging. It can be harmful both for an individual and for friends and family. °SYMPTOMS °Everyone reacts to stress differently. There are some common effects that help us recognize it. In times of extreme stress, people may: °· Shake uncontrollably. °· Breathe faster and deeper than normal (hyperventilate). °· Vomit. °· For people with asthma, stress can trigger an attack. °· For some people, stress may trigger migraine headaches, ulcers, and body pain. °PHYSICAL EFFECTS OF STRESS MAY INCLUDE: °· Loss of energy. °· Skin problems. °· Aches and pains resulting from tense muscles, including neck ache, backache and tension headaches. °· Increased pain from arthritis and other conditions. °· Irregular heart beat (palpitations). °· Periods of irritability or anger. °· Apathy or depression. °· Anxiety (feeling uptight or worrying). °· Unusual behavior. °· Loss of appetite. °· Comfort eating. °· Lack of concentration. °· Loss of, or decreased, sex-drive. °· Increased smoking, drinking, or recreational drug use. °· For women, missed periods. °· Ulcers, joint pain, and muscle pain. °Post-traumatic stress is the stress caused by any serious accident, strong emotional damage, or extremely difficult or violent experience such as rape or war. °Post-traumatic stress victims can experience mixtures of emotions such as fear, shame, depression, guilt or anger. It may include recurrent memories or images that may be haunting. These feelings can last for weeks, months or even years after the traumatic event that triggered them. Specialized treatment, possibly with medicines and psychological therapies, is available. °If stress is causing physical   symptoms, severe distress or making it difficult for you to function as normal, it is worth seeing your caregiver. It is important to remember that although stress is a usual part of life, extreme or prolonged stress can lead to other illnesses that will  need treatment. It is better to visit a doctor sooner rather than later. Stress has been linked to the development of high blood pressure and heart disease, as well as insomnia and depression. °There is no diagnostic test for stress since everyone reacts to it differently. But a caregiver will be able to spot the physical symptoms, such as: °· Headaches. °· Shingles. °· Ulcers. °Emotional distress such as intense worry, low mood or irritability should be detected when the doctor asks pertinent questions to identify any underlying problems that might be the cause. In case there are physical reasons for the symptoms, the doctor may also want to do some tests to exclude certain conditions. °If you feel that you are suffering from stress, try to identify the aspects of your life that are causing it. Sometimes you may not be able to change or avoid them, but even a small change can have a positive ripple effect. A simple lifestyle change can make all the difference. °STRATEGIES THAT CAN HELP DEAL WITH STRESS: °· Delegating or sharing responsibilities. °· Avoiding confrontations. °· Learning to be more assertive. °· Regular exercise. °· Avoid using alcohol or street drugs to cope. °· Eating a healthy, balanced diet, rich in fruit and vegetables and proteins. °· Finding humor or absurdity in stressful situations. °· Never taking on more than you know you can handle comfortably. °· Organizing your time better to get as much done as possible. °· Talking to friends or family and sharing your thoughts and fears. °· Listening to music or relaxation tapes. °· Relaxation techniques like deep breathing, meditation, and yoga. °· Tensing and then relaxing your muscles, starting at the toes and working up to the head and neck. °If you think that you would benefit from help, either in identifying the things that are causing your stress or in learning techniques to help you relax, see a caregiver who is capable of helping you with  this. Rather than relying on medications, it is usually better to try and identify the things in your life that are causing stress and try to deal with them. °There are many techniques of managing stress including counseling, psychotherapy, aromatherapy, yoga, and exercise. Your caregiver can help you determine what is best for you. °Document Released: 01/04/2003 Document Revised: 10/19/2013 Document Reviewed: 12/01/2007 °[employer]® Patient Information ©2015 [employer]. This information is not intended to replace advice given to you by your health care provider. Make sure you discuss any questions you have with your health care provider. ° °

## 2014-12-01 NOTE — ED Provider Notes (Signed)
CSN: [id number]     Arrival date & time 12/01/14  1643 History   First MD Initiated Contact with Patient 12/01/14 1849     Chief Complaint  Patient presents with  . Chest Pain     (Consider location/radiation/quality/duration/timing/severity/associated sxs/prior Treatment) HPI    PCP: Dois DavenportICHTER,KAREN L., MD Blood pressure 119/74, pulse 65, temperature 98.3 F (36.8 C), temperature source Oral, resp. rate 19, height 5\' 7"  (1.702 m), weight 160 lb (72.576 kg), last menstrual period 11/16/2014, SpO2 100 %.  Natalie Colon is a 34 y.o.female with a significant PMH of ADHD, Depression, Herpes, asthma and mitral valve prolapse presents to the ER with complaints of chest pain, anxiety, headache and nausea. She reports the symptoms have been persisting for 3 days. She reports taking Klonopin and her symptoms all relieving with the dose. She describes the headache as bitemporal and throbbing. She reports the pain being a grabbing pain when she gets very stressed. She reports having frequent anxiety attacks and panic attacks because of life stressors. She says that she is in school and working, has kids and is having severe relationship problems. She denies being suicidal or homicidal. She reports having a plan to better the situation and help her anxiety. She reports having a long history of panic attacks which feel a lot like this.She came to the ER because she is concerned about whether she might be having a heart attack due to having mitral valve prolapse. She denies coughing, fevers, lower extremity swelling, weakness, confusion, neck pain, ear pain, sore throat, abdominal pain, vomiting, diarrhea.    Past Medical History  Diagnosis Date  . ADHD (attention deficit hyperactivity disorder)   . Depression   . Herpes   . Asthma   . Mitral valve prolapse 2010    [place]   Past Surgical History  Procedure Laterality Date  . Cystectomy      Neck  . Pilonidal cyst removal     . Tubal implant procedure     Family History  Problem Relation Age of Onset  . Heart attack Maternal Grandmother   . COPD Maternal Grandmother   . COPD Mother   . Hypertension Mother   . Heart disease Maternal Grandfather   . COPD Maternal Grandfather    History  Substance Use Topics  . Smoking status: Current Some Day Smoker -- 0.20 packs/day for 10 years    Types: Cigarettes  . Smokeless tobacco: Not on file  . Alcohol Use: 0.5 oz/week    1 drink(s) per week     Comment: occasion   OB History    No data available     Review of Systems  10 Systems reviewed and are negative for acute change except as noted in the HPI.     Allergies  Hydrocodone-acetaminophen; Latex; Penicillins; and Vicodin  Home Medications   Prior to Admission medications   Medication Sig Start Date End Date Taking? Authorizing Provider  fexofenadine (ALLEGRA) 180 MG tablet Take 1 tablet (180 mg total) by mouth daily. 06/11/13   Linna HoffJames D Kindl, MD  Lactobacillus-Inulin (CULTURELLE DIGESTIVE HEALTH PO) Take by mouth.    Historical Provider, MD  mometasone (NASONEX) 50 MCG/ACT nasal spray Place 2 sprays into the nose daily.    Historical Provider, MD  valACYclovir (VALTREX) 500 MG tablet Take 500 mg by mouth 2 (two) times daily.    Historical Provider, MD  Vortioxetine HBr (BRINTELLIX) 5 MG TABS Take by mouth daily.  Historical Provider, MD   BP 119/74 mmHg  Pulse 65  Temp(Src) 98.3 F (36.8 C) (Oral)  Resp 19  Ht  (1.702 m)  Wt 160 lb (72.576 kg)  BMI 25.05 kg/m2  SpO2 100%  LMP 11/16/2014 Physical Exam  Constitutional: She appears well-developed and well-nourished. No distress.  HENT:  Head: Normocephalic and atraumatic.  Eyes: Pupils are equal, round, and reactive to light.  Neck: Normal range of motion. Neck supple.  Cardiovascular: Normal rate and regular rhythm.   Pulmonary/Chest: Effort normal and breath sounds normal. She exhibits no tenderness and no crepitus.  Abdominal:  Soft.  Neurological: She is alert.  Cranial nerves II-VIII and X-XII evaluated and show no deficits. Pt alert and oriented x 3 Upper and lower extremity strength is symmetrical and physiologic Normal muscular tone No facial droop Coordination intact, no limb ataxia, No pronator drift   Skin: Skin is warm and dry.  Psychiatric: Her mood appears anxious. Her speech is not rapid and/or pressured. She is not actively hallucinating. She exhibits a depressed mood. She expresses no homicidal and no suicidal ideation. She expresses no suicidal plans and no homicidal plans.  flat affect  Nursing note and vitals reviewed.   ED Course  Procedures (including critical care time) Labs Review Labs Reviewed  I-STAT CHEM 8, ED - Abnormal; Notable for the following:    Chloride 83 (*)    Calcium, Ion 1.24 (*)    All other components within normal limits  CBC WITH DIFFERENTIAL/PLATELET  I-STAT TROPOININ, ED  I-STAT CG4 LACTIC ACID, ED  POC URINE PREG, ED    Imaging Review No results found.   EKG Interpretation None      MDM   Final diagnoses:  Anxiety  Stress   patient had negative urine pregnancy test, troponin, CBC and i-STAT lactic acid were all within normal limits. EKG shows no STEMI or heart block. Patient declines IV for migraine cocktail, Ibuprofen PO or chest xray. She reports knowing where she plans to go and what she plans to do, denies SI/HI or anything harmful. She plans to go where she has family/friends and support. I told her that without chest xray I was unable to prove she did not have enlarged heart, a mass, lung infection or some other abnormality and this could be potentially life threatening. She voices her understanding.  The patient denies any symptoms of neurological impairment or TIA's; no amaurosis, diplopia, dysphasia, or unilateral disturbance of motor or sensory function. No loss of balance or vertigo.  The patients are consistent with stress, which she has a  lot of. I'd prefer her to have chest xray but will not have her sign out AMA as the patient is reliable to return if symptoms change or worsen- she has concerns of increasing her hospital bill.    33 y.o.Jethro Bastos Colon's evaluation in the Emergency Department is complete. It has been determined that no acute conditions requiring further emergency intervention are present at this time. The patient/guardian have been advised of the diagnosis and plan. We have discussed signs and symptoms that warrant return to the ED, such as changes or worsening in symptoms.  Vital signs are stable at discharge. Filed Vitals:   12/01/14 1930  BP:   Pulse: 65  Temp:   Resp: 19    Patient/guardian has voiced understanding and agreed to follow-up with the PCP or specialist.     Dorthula Matas, PA-C 12/01/14 2016  Gerhard Munch, MD 12/01/14 2019  Gerhard Munch, MD 12/01/14 2020

## 2014-12-01 NOTE — ED Notes (Signed)
Pt c/o left chest pain off and on x's 3 days.  Also c/o headache, st's she took her medication for anxiety which makes her sleepy but when she wakes up she still has the headache.  Pt also c/o nausea without vomiting x's 3 days.

## 2015-01-13 ENCOUNTER — Encounter (HOSPITAL_COMMUNITY): Payer: Self-pay

## 2015-01-13 ENCOUNTER — Emergency Department (HOSPITAL_COMMUNITY)
Admission: EM | Admit: 2015-01-13 | Discharge: 2015-01-13 | Disposition: A | Discharge status: Home / Self Care | Payer: 59 | Attending: Emergency Medicine | Admitting: Emergency Medicine

## 2015-01-13 ENCOUNTER — Emergency Department (HOSPITAL_COMMUNITY): Payer: 59

## 2015-01-13 DIAGNOSIS — Y998 Other external cause status: Secondary | ICD-10-CM | POA: Insufficient documentation

## 2015-01-13 DIAGNOSIS — Z79899 Other long term (current) drug therapy: Secondary | ICD-10-CM | POA: Insufficient documentation

## 2015-01-13 DIAGNOSIS — Z7951 Long term (current) use of inhaled steroids: Secondary | ICD-10-CM | POA: Insufficient documentation

## 2015-01-13 DIAGNOSIS — Z8659 Personal history of other mental and behavioral disorders: Secondary | ICD-10-CM | POA: Diagnosis not present

## 2015-01-13 DIAGNOSIS — Z720 Tobacco use: Secondary | ICD-10-CM | POA: Diagnosis not present

## 2015-01-13 DIAGNOSIS — Y9389 Activity, other specified: Secondary | ICD-10-CM | POA: Diagnosis not present

## 2015-01-13 DIAGNOSIS — S6991XA Unspecified injury of right wrist, hand and finger(s), initial encounter: Secondary | ICD-10-CM | POA: Diagnosis present

## 2015-01-13 DIAGNOSIS — T148XXA Other injury of unspecified body region, initial encounter: Secondary | ICD-10-CM

## 2015-01-13 DIAGNOSIS — S62636A Displaced fracture of distal phalanx of right little finger, initial encounter for closed fracture: Secondary | ICD-10-CM | POA: Diagnosis not present

## 2015-01-13 DIAGNOSIS — W2201XA Walked into wall, initial encounter: Secondary | ICD-10-CM | POA: Diagnosis not present

## 2015-01-13 DIAGNOSIS — Z91040 Latex allergy status: Secondary | ICD-10-CM | POA: Insufficient documentation

## 2015-01-13 DIAGNOSIS — Y9289 Other specified places as the place of occurrence of the external cause: Secondary | ICD-10-CM | POA: Diagnosis not present

## 2015-01-13 DIAGNOSIS — J45909 Unspecified asthma, uncomplicated: Secondary | ICD-10-CM | POA: Diagnosis not present

## 2015-01-13 DIAGNOSIS — Z880 Allergy status to penicillin: Secondary | ICD-10-CM | POA: Insufficient documentation

## 2015-01-13 MED ORDER — ACETAMINOPHEN 500 MG PO TABS
1000.0000 mg | ORAL_TABLET | Freq: Once | ORAL | Status: AC
  Administered 2015-01-13: 1000 mg via ORAL
  Filled 2015-01-13: qty 2

## 2015-01-13 MED ORDER — TRAMADOL HCL 50 MG PO TABS: 50.0000 mg | ORAL_TABLET | Freq: Four times a day (QID) | ORAL | Status: DC | PRN

## 2015-01-13 NOTE — Discharge Instructions (Signed)
Fracture A fracture is a break in a bone, due to a force on the bone that is greater than the bone's strength can handle. There are many types of fractures, including:  Complete fracture: The break passes completely through the bone.  Displaced: The ends of the bone fragments are not properly aligned.  Non-displaced: The ends of the bone fragments are in proper alignment.  Incomplete fracture (greenstick): The break does not pass completely through the bone. Incomplete fractures may or may not be angular (angulated).  Open fracture (compound): Part of the broken bone pokes through the skin. Open fractures have a high risk for infection.  Closed fracture: The fracture has not broken through the skin.  Comminuted fracture: The bone is broken into more than two pieces.  Compression fracture: The break occurs from extreme pressure on the bone (includes crushing injury).  Impacted fracture: The broken bone ends have been driven into each other.  Avulsion fracture: A ligament or tendon pulls a small piece of bone off from the main bony segment.  Pathologic fracture: A fracture due to the bone being made weak by a disease (osteoporosis or tumors).  Stress fracture: A fracture caused by intense exercise or repetitive and prolonged pressure that makes the bone weak. SYMPTOMS   Pain, tenderness, bleeding, bruising, and swelling at the fracture site.  Weakness and inability to bear weight on the injured extremity.  Paleness and deformity (sometimes).  Loss of pulse, numbness, tingling, or paralysis below the fracture site (usually a limb); these are emergencies. CAUSES  Bone being subjected to a force greater than its strength. RISK INCREASES WITH:  Contact sports and falls from heights.  Previous or current bone problems (osteoporosis or tumors).  Poor balance.  Poor strength and flexibility. PREVENTION   Warm up and stretch properly before activity.  Maintain physical  fitness:  Cardiovascular fitness.  Muscle strength.  Flexibility and endurance.  Wear proper protective equipment.  Use proper exercise technique. RELATED COMPLICATIONS   Bone fails to heal (nonunion).  Bone heals in a poor position (malunion).  Low blood volume (hypovolemic), shock due to blood loss.  Clump of fat cells travels through the blood (fat embolus) from the injury site to the lungs or brain (more common with thigh fractures).  Obstruction of nearby arteries. TREATMENT  Treatment first requires realigning of the bones (reduction) by a medically trained person, if the fracture is displaced. After realignment if the fracture is completed, or for non-displaced fractures, ice and medicine are used to reduce pain and inflammation. The bone and adjacent joints are then restrained with a splint, cast, or brace to allow the bones to heal without moving. Surgery is sometimes needed, to reposition the bones and hold the position with rods, pins, plates, or screws. Restraint for long periods of time may result in muscle and joint weakness or build up of fluid in tissues (edema). For this reason, physical therapy is often needed to regain strength and full range of motion. Recovery is complete when there is no bone motion at the fracture site and x-rays (radiographs) show complete healing.  MEDICATION   General anesthesia, sedation, or muscle relaxants may be needed to allow for realignment of the fracture. If pain medicine is needed, nonsteroidal anti-inflammatory medicines (aspirin and ibuprofen), or other minor pain relievers (acetaminophen), are often advised.  Do not take pain medicine for 7 days before surgery.  Stronger pain relievers may be prescribed by your caregiver. Use only as directed and only as much  as you need. SEEK MEDICAL CARE IF:   The following occur after restraint or surgery. (Report any of these signs immediately):  Swelling above or below the fracture  site.  Severe, persistent pain.  Blue or gray skin below the fracture site, especially under the nails. Numbness or loss of feeling below the fracture site. Document Released: 10/14/2005 Document Revised: 09/30/2012 Document Reviewed: 01/26/2009 [facility] Patient Information 2015 [place] [place]. This information is not intended to replace advice given to you by your health care provider. Make sure you discuss any questions you have with your health care provider.  Cast or Splint Care Casts and splints support injured limbs and keep bones from moving while they heal. It is important to care for your cast or splint at home.  HOME CARE INSTRUCTIONS  Keep the cast or splint uncovered during the drying period. It can take 24 to 48 hours to dry if it is made of plaster. A fiberglass cast will dry in less than 1 hour.  Do not rest the cast on anything harder than a pillow for the first 24 hours.  Do not put weight on your injured limb or apply pressure to the cast until your health care provider gives you permission.  Keep the cast or splint dry. Wet casts or splints can lose their shape and may not support the limb as well. A wet cast that has lost its shape can also create harmful pressure on your skin when it dries. Also, wet skin can become infected.  Cover the cast or splint with a plastic bag when bathing or when out in the rain or snow. If the cast is on the trunk of the body, take sponge baths until the cast is removed.  If your cast does become wet, dry it with a towel or a blow dryer on the cool setting only.  Keep your cast or splint clean. Soiled casts may be wiped with a moistened cloth.  Do not place any hard or soft foreign objects under your cast or splint, such as cotton, toilet paper, lotion, or powder.  Do not try to scratch the skin under the cast with any object. The object could get stuck inside the cast. Also, scratching could lead to an infection. If itching is a  problem, use a blow dryer on a cool setting to relieve discomfort.  Do not trim or cut your cast or remove padding from inside of it.  Exercise all joints next to the injury that are not immobilized by the cast or splint. For example, if you have a long leg cast, exercise the hip joint and toes. If you have an arm cast or splint, exercise the shoulder, elbow, thumb, and fingers.  Elevate your injured arm or leg on 1 or 2 pillows for the first 1 to 3 days to decrease swelling and pain.It is best if you can comfortably elevate your cast so it is higher than your heart. SEEK MEDICAL CARE IF:   Your cast or splint cracks.  Your cast or splint is too tight or too loose.  You have unbearable itching inside the cast.  Your cast becomes wet or develops a soft spot or area.  You have a bad smell coming from inside your cast.  You get an object stuck under your cast.  Your skin around the cast becomes red or raw.  You have new pain or worsening pain after the cast has been applied. SEEK IMMEDIATE MEDICAL CARE IF:   You  have fluid leaking through the cast.  You are unable to move your fingers or toes.  You have discolored (blue or white), cool, painful, or very swollen fingers or toes beyond the cast.  You have tingling or numbness around the injured area.  You have severe pain or pressure under the cast.  You have any difficulty with your breathing or have shortness of breath.  You have chest pain. Document Released: 10/11/2000 Document Revised: 08/04/2013 Document Reviewed: 04/22/2013 [facility] Patient Information 2015 [place], [place]. This information is not intended to replace advice given to you by your health care provider. Make sure you discuss any questions you have with your health care provider.

## 2015-01-13 NOTE — ED Notes (Signed)
Pt states that around 2100 that she got upset and hit a hard surface with her right hand and she "heard something crack". Upon inspection in the room the right pinky finger is bruised. Pt has limited movement in pinky finger. Pt in no acute distress in room.

## 2015-01-13 NOTE — ED Provider Notes (Signed)
CSN: [id number]     Arrival date & time 01/13/15  0531 History   First MD Initiated Contact with Patient 01/13/15 [phone]     Chief Complaint  Patient presents with  . Hand Injury     (Consider location/radiation/quality/duration/timing/severity/associated sxs/prior Treatment) HPI Natalie MurdochKennesha N Colon is a 34 y.o. female who comes in for evaluation of right finger pain. Patient states at approximately 11:00 last night she got upset and tried to knock an object off of her dresser, but hit the wall instead, injuring her right pinky finger. She reports calling a "nurse line" and was told to wrap her pinky finger to her ring finger. Patient reports doing this without relief of her symptoms. She did not take any other medications to improve her discomfort. She rates her discomfort as a 8/10 and characterizes the discomfort as a throbbing sensation. Denies any numbness, weakness or tingling. No open wounds or bleeding. No other aggravating or modifying factors.  Past Medical History  Diagnosis Date  . ADHD (attention deficit hyperactivity disorder)   . Depression   . Herpes   . Asthma   . Mitral valve prolapse 2010    [place]   Past Surgical History  Procedure Laterality Date  . Cystectomy      Neck  . Pilonidal cyst removal    . Tubal implant procedure     Family History  Problem Relation Age of Onset  . Heart attack Maternal Grandmother   . COPD Maternal Grandmother   . COPD Mother   . Hypertension Mother   . Heart disease Maternal Grandfather   . COPD Maternal Grandfather    History  Substance Use Topics  . Smoking status: Current Some Day Smoker -- 0.20 packs/day for 10 years    Types: Cigarettes  . Smokeless tobacco: Not on file  . Alcohol Use: 0.5 oz/week    1 drink(s) per week     Comment: occasion   OB History    No data available     Review of Systems  Constitutional: Negative for fever.  Respiratory: Negative for shortness of breath.    Cardiovascular: Negative for chest pain.  Musculoskeletal:       Finger pain  Skin: Negative for rash and wound.  Neurological: Negative for weakness and numbness.      Allergies  Hydrocodone-acetaminophen; Latex; Penicillins; and Vicodin  Home Medications   Prior to Admission medications   Medication Sig Start Date End Date Taking? Authorizing Provider  fexofenadine (ALLEGRA) 180 MG tablet Take 1 tablet (180 mg total) by mouth daily. 06/11/13   Linna HoffJames D Kindl, MD  Lactobacillus-Inulin (CULTURELLE DIGESTIVE HEALTH PO) Take by mouth.    Historical Provider, MD  mometasone (NASONEX) 50 MCG/ACT nasal spray Place 2 sprays into the nose daily.    Historical Provider, MD  traMADol (ULTRAM) 50 MG tablet Take 1 tablet (50 mg total) by mouth every 6 (six) hours as needed. 01/13/15   Joycie PeekBenjamin Doninique Lwin, PA-C  valACYclovir (VALTREX) 500 MG tablet Take 500 mg by mouth 2 (two) times daily.    Historical Provider, MD  Vortioxetine HBr (BRINTELLIX) 5 MG TABS Take by mouth daily.    Historical Provider, MD   BP 111/75 mmHg  Pulse 55  Temp(Src) 98.4 F (36.9 C) (Oral)  Resp 16  Ht 5\' 7"  (1.702 m)  Wt 160 lb (72.576 kg)  BMI 25.05 kg/m2  SpO2 100%  LMP 01/12/2015 (Exact Date) Physical Exam  Constitutional:  Awake, alert, nontoxic  appearance.  HENT:  Head: Atraumatic.  Eyes: Right eye exhibits no discharge. Left eye exhibits no discharge.  Neck: Neck supple.  Pulmonary/Chest: Effort normal. She exhibits no tenderness.  Abdominal: Soft. There is no tenderness. There is no rebound.  Musculoskeletal: She exhibits no tenderness.  Baseline ROM, no obvious new focal weakness. Diffuse tenderness to distal phalanx of the right fifth digit. No evidence of subungual hematoma. No evidence of open fracture.  Neurological:  Mental status and motor strength appears baseline for patient and situation. Sensation intact  Skin: No rash noted.  Psychiatric: She has a normal mood and affect.  Nursing note and  vitals reviewed.   ED Course  Procedures (including critical care time)  SPLINT APPLICATION Date/Time: 9:10 AM Authorized by: Sharlene Motts Consent: Verbal consent obtained. Risks and benefits: risks, benefits and alternatives were discussed Consent given by: patient Splint applied by: orthopedic technician Location details: Right fifth digit  Splint type: Aluminum finger splint  Supplies used: Aluminum finger splint  Post-procedure: The splinted body part was neurovascularly unchanged following the procedure. Patient tolerance: Patient tolerated the procedure well with no immediate complications.    Labs Review Labs Reviewed - No data to display  Imaging Review Dg Hand Complete Right  01/13/2015   CLINICAL DATA:  Hit hand on wall. Pain in the fifth finger with bruising.  EXAM: RIGHT HAND - COMPLETE 3+ VIEW  COMPARISON:  None.  FINDINGS: There is a minimally displaced fracture involving the fifth finger distal phalanx at the base. This fracture probably involves the articulating surface. No other definite fractures.  IMPRESSION: Minimally displaced fracture involving the distal phalanx of the fifth finger.   Electronically Signed   By: Richarda Overlie M.D.   On: 01/13/2015 07:42     EKG Interpretation None     Meds given in ED:  Medications  acetaminophen (TYLENOL) tablet 1,000 mg (1,000 mg Oral Given 01/13/15 0745)    Discharge Medication List as of 01/13/2015  8:10 AM    START taking these medications   Details  traMADol (ULTRAM) 50 MG tablet Take 1 tablet (50 mg total) by mouth every 6 (six) hours as needed., Starting 01/13/2015, Until Discontinued, Print       Filed Vitals:   01/13/15 0600 01/13/15 0615 01/13/15 0700 01/13/15 0823  BP: 104/78 98/67 109/76 111/75  Pulse: 70 64 62 55  Temp:    98.4 F (36.9 C)  TempSrc:    Oral  Resp:    16  Height:      Weight:      SpO2: 98% 99% 100%     MDM  Vitals stable - WNL -afebrile Pt resting comfortably in  ED. Imaging--Minimally displaced fracture involving the distal phalanx of the fifth finger.  DDX--no evidence of open fracture or subungual hematoma. Maintains full range of motion of all digits. Will place patient in splint for comfort. DC with pain medicines and referral to orthopedics if symptoms worsen. Also discussed follow-up with her PCP for further evaluation and management of symptoms.  I discussed all relevant lab findings and imaging results with pt and they verbalized understanding. Discussed f/u with PCP within 48 hrs and return precautions, pt very amenable to plan. Patient stable, in good condition and is appropriate for discharge.  Final diagnoses:  Fracture        Joycie Peek, PA-C 01/13/15 1610  Dione Booze, MD 01/14/15 [phone]

## 2015-01-25 ENCOUNTER — Emergency Department (INDEPENDENT_AMBULATORY_CARE_PROVIDER_SITE_OTHER)
Admission: EM | Admit: 2015-01-25 | Discharge: 2015-01-25 | Disposition: A | Discharge status: Home / Self Care | Payer: 59 | Source: Home / Self Care | Attending: Family Medicine | Admitting: Family Medicine

## 2015-01-25 ENCOUNTER — Encounter (HOSPITAL_COMMUNITY): Payer: Self-pay | Admitting: Emergency Medicine

## 2015-01-25 DIAGNOSIS — M545 Low back pain, unspecified: Secondary | ICD-10-CM

## 2015-01-25 MED ORDER — CYCLOBENZAPRINE HCL 5 MG PO TABS: 5.0000 mg | ORAL_TABLET | Freq: Every evening | ORAL | Status: DC | PRN

## 2015-01-25 MED ORDER — DICLOFENAC SODIUM 75 MG PO TBEC: 75.0000 mg | DELAYED_RELEASE_TABLET | Freq: Two times a day (BID) | ORAL | Status: DC | PRN

## 2015-01-25 NOTE — Discharge Instructions (Signed)
Thank you for coming in today. °Come back or go to the emergency room if you notice new weakness new numbness problems walking or bowel or bladder problems. ° ° °Back Exercises °These exercises may help you when beginning to rehabilitate your injury. Your symptoms may resolve with or without further involvement from your physician, physical therapist or athletic trainer. While completing these exercises, remember:  °· Restoring tissue flexibility helps normal motion to return to the joints. This allows healthier, less painful movement and activity. °· An effective stretch should be held for at least 30 seconds. °· A stretch should never be painful. You should only feel a gentle lengthening or release in the stretched tissue. °STRETCH - Extension, Prone on Elbows  °· Lie on your stomach on the floor, a bed will be too soft. Place your palms about shoulder width apart and at the height of your head. °· Place your elbows under your shoulders. If this is too painful, stack pillows under your chest. °· Allow your body to relax so that your hips drop lower and make contact more completely with the floor. °· Hold this position for __________ seconds. °· Slowly return to lying flat on the floor. °Repeat __________ times. Complete this exercise __________ times per day.  °RANGE OF MOTION - Extension, Prone Press Ups  °· Lie on your stomach on the floor, a bed will be too soft. Place your palms about shoulder width apart and at the height of your head. °· Keeping your back as relaxed as possible, slowly straighten your elbows while keeping your hips on the floor. You may adjust the placement of your hands to maximize your comfort. As you gain motion, your hands will come more underneath your shoulders. °· Hold this position __________ seconds. °· Slowly return to lying flat on the floor. °Repeat __________ times. Complete this exercise __________ times per day.  °RANGE OF MOTION- Quadruped, Neutral Spine  °· Assume a hands  and knees position on a firm surface. Keep your hands under your shoulders and your knees under your hips. You may place padding under your knees for comfort. °· Drop your head and point your tail bone toward the ground below you. This will round out your low back like an angry cat. Hold this position for __________ seconds. °· Slowly lift your head and release your tail bone so that your back sags into a large arch, like an old horse. °· Hold this position for __________ seconds. °· Repeat this until you feel limber in your low back. °· Now, find your "sweet spot." This will be the most comfortable position somewhere between the two previous positions. This is your neutral spine. Once you have found this position, tense your stomach muscles to support your low back. °· Hold this position for __________ seconds. °Repeat __________ times. Complete this exercise __________ times per day.  °STRETCH - Flexion, Single Knee to Chest  °· Lie on a firm bed or floor with both legs extended in front of you. °· Keeping one leg in contact with the floor, bring your opposite knee to your chest. Hold your leg in place by either grabbing behind your thigh or at your knee. °· Pull until you feel a gentle stretch in your low back. Hold __________ seconds. °· Slowly release your grasp and repeat the exercise with the opposite side. °Repeat __________ times. Complete this exercise __________ times per day.  °STRETCH - Hamstrings, Standing °· Stand or sit and extend your right / left   leg, placing your foot on a chair or foot stool °· Keeping a slight arch in your low back and your hips straight forward. °· Lead with your chest and lean forward at the waist until you feel a gentle stretch in the back of your right / left knee or thigh. (When done correctly, this exercise requires leaning only a small distance.) °· Hold this position for __________ seconds. °Repeat __________ times. Complete this stretch __________ times per  day. °STRENGTHENING - Deep Abdominals, Pelvic Tilt  °· Lie on a firm bed or floor. Keeping your legs in front of you, bend your knees so they are both pointed toward the ceiling and your feet are flat on the floor. °· Tense your lower abdominal muscles to press your low back into the floor. This motion will rotate your pelvis so that your tail bone is scooping upwards rather than pointing at your feet or into the floor. °· With a gentle tension and even breathing, hold this position for __________ seconds. °Repeat __________ times. Complete this exercise __________ times per day.  °STRENGTHENING - Abdominals, Crunches  °· Lie on a firm bed or floor. Keeping your legs in front of you, bend your knees so they are both pointed toward the ceiling and your feet are flat on the floor. Cross your arms over your chest. °· Slightly tip your chin down without bending your neck. °· Tense your abdominals and slowly lift your trunk high enough to just clear your shoulder blades. Lifting higher can put excessive stress on the low back and does not further strengthen your abdominal muscles. °· Control your return to the starting position. °Repeat __________ times. Complete this exercise __________ times per day.  °STRENGTHENING - Quadruped, Opposite UE/LE Lift  °· Assume a hands and knees position on a firm surface. Keep your hands under your shoulders and your knees under your hips. You may place padding under your knees for comfort. °· Find your neutral spine and gently tense your abdominal muscles so that you can maintain this position. Your shoulders and hips should form a rectangle that is parallel with the floor and is not twisted. °· Keeping your trunk steady, lift your right hand no higher than your shoulder and then your left leg no higher than your hip. Make sure you are not holding your breath. Hold this position __________ seconds. °· Continuing to keep your abdominal muscles tense and your back steady, slowly return  to your starting position. Repeat with the opposite arm and leg. °Repeat __________ times. Complete this exercise __________ times per day. °Document Released: 11/01/2005 Document Revised: 01/06/2012 Document Reviewed: 01/26/2009 °[employer]® Patient Information ©2015 [employer]. This information is not intended to replace advice given to you by your health care provider. Make sure you discuss any questions you have with your health care provider. ° °
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
## 2015-10-03 DIAGNOSIS — F419 Anxiety disorder, unspecified: Secondary | ICD-10-CM

## 2015-10-03 DIAGNOSIS — R5383 Other fatigue: Secondary | ICD-10-CM

## 2015-10-03 HISTORY — DX: Other fatigue: R53.83

## 2015-10-03 HISTORY — DX: Anxiety disorder, unspecified: F41.9

## 2016-04-26 IMAGING — CR DG HAND COMPLETE 3+V*R*
3 series · 3 of 3 positions shown · non-contrast
Comparison: None.

CLINICAL DATA: Hit hand on wall. Pain in the fifth finger with
bruising.

EXAM:
RIGHT HAND - COMPLETE 3+ VIEW

[x hand pa right]
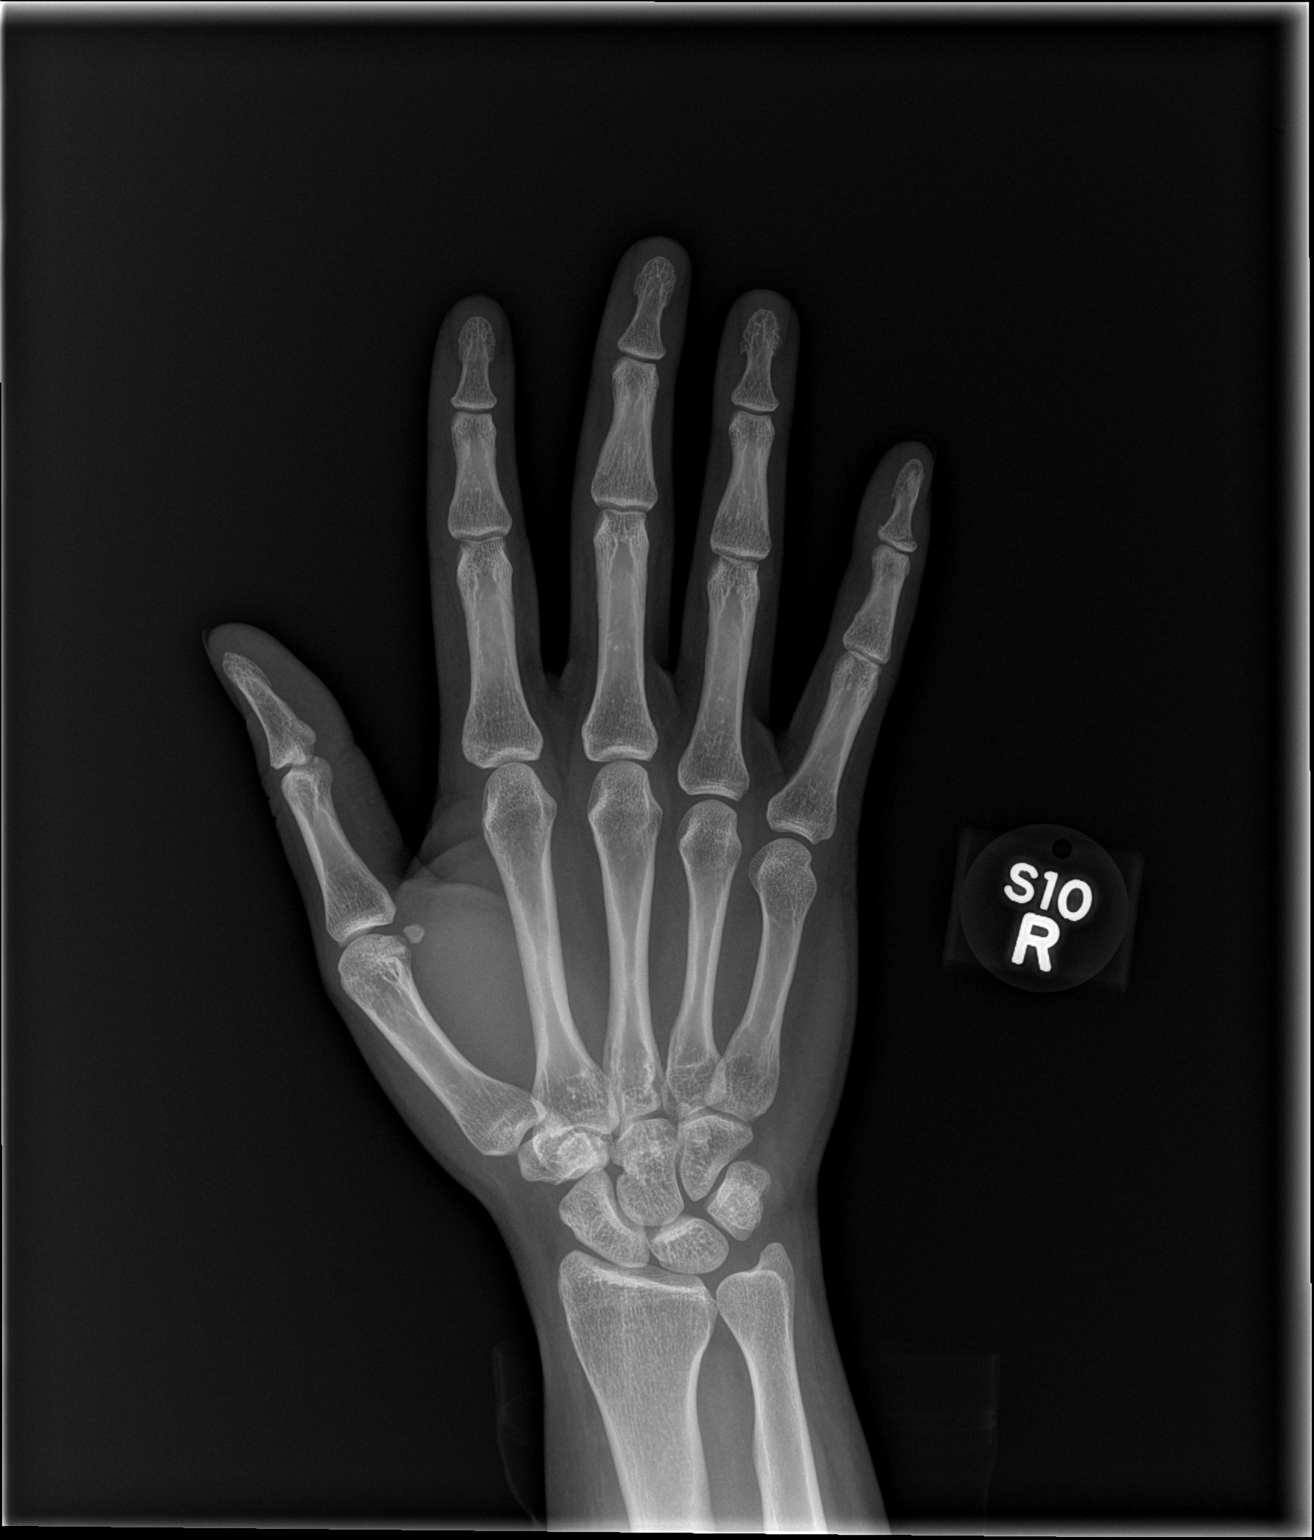

[x hand obl right]
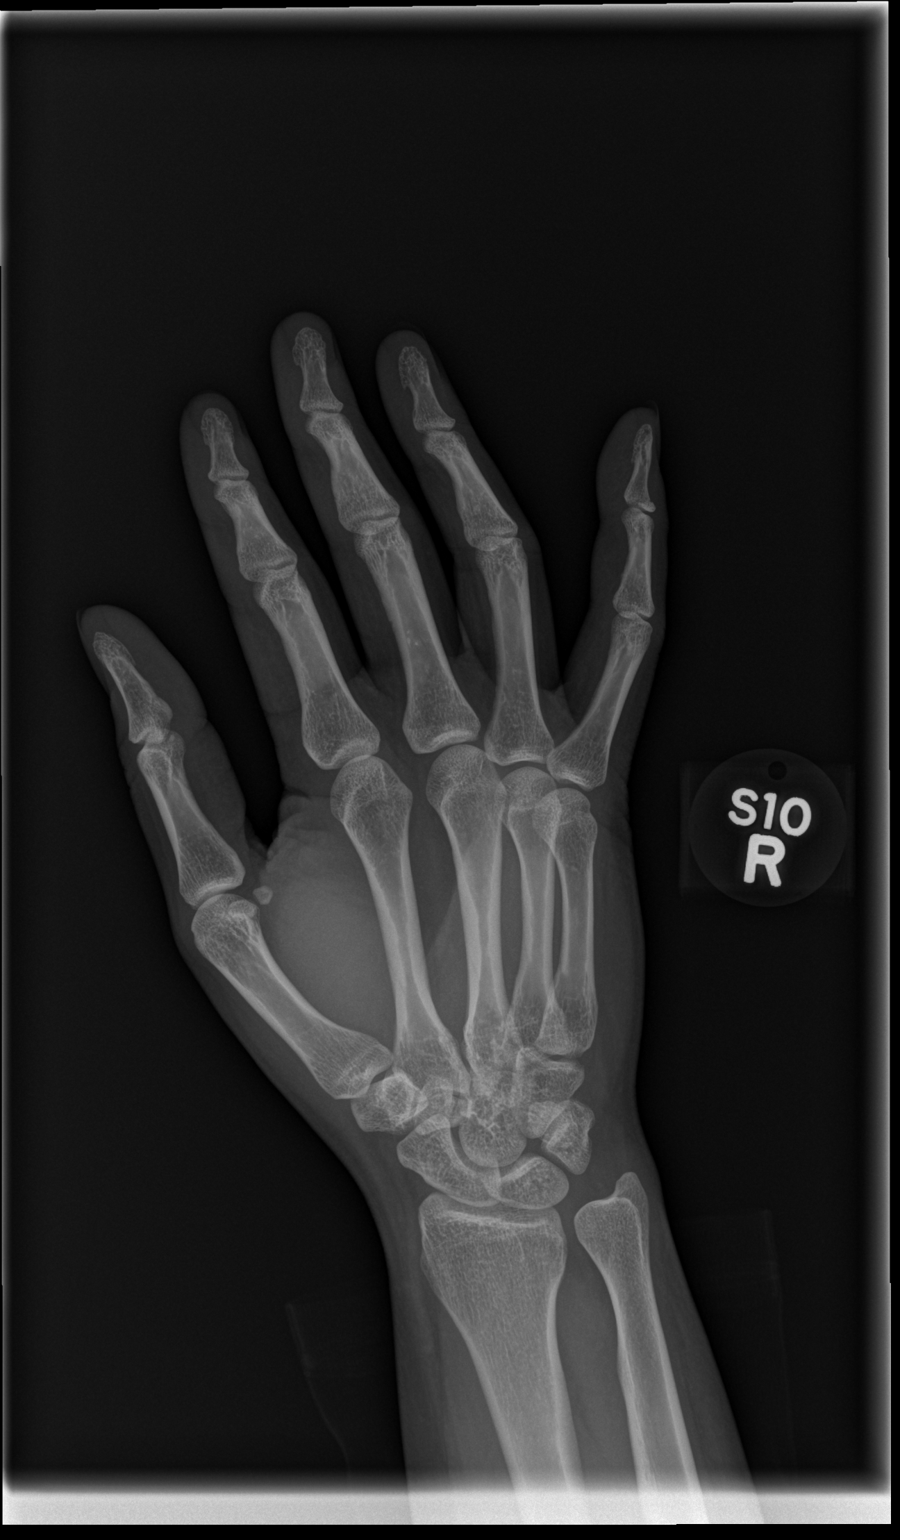

[x hand lat right]
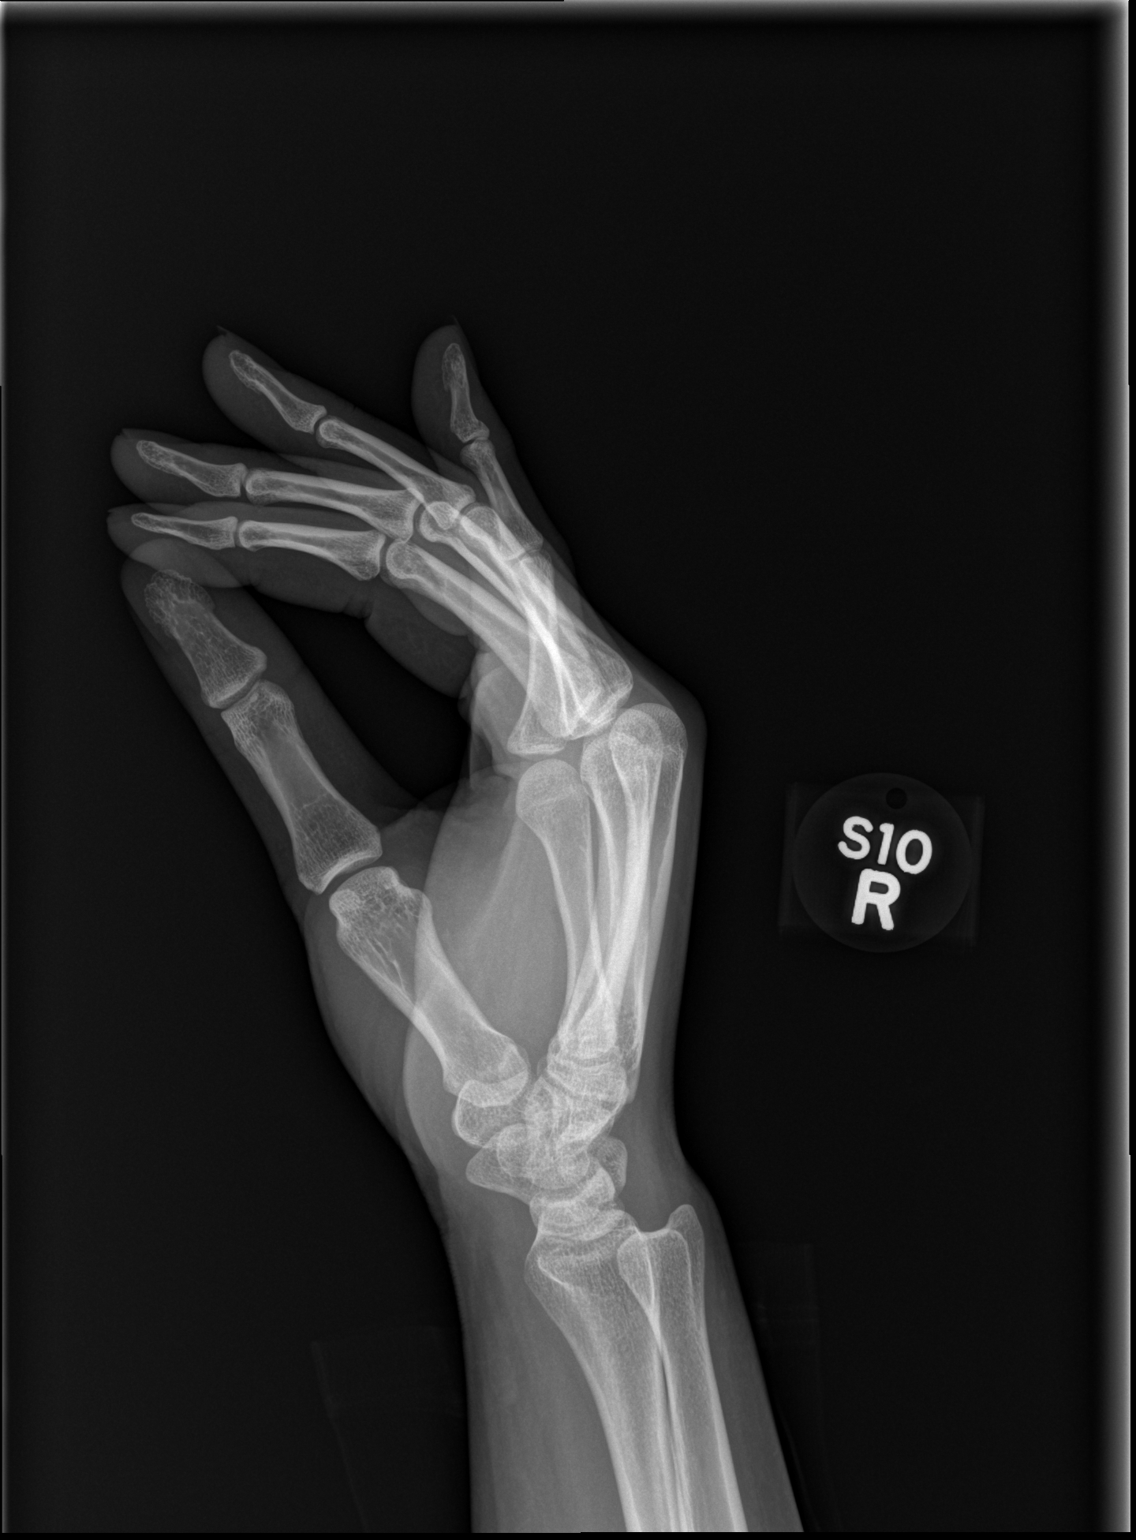

[3 of 3 positions shown; findings below may reference images not displayed]

FINDINGS: There is a minimally displaced fracture involving the fifth finger
distal phalanx at the base. This fracture probably involves the
articulating surface. No other definite fractures.
IMPRESSION: Minimally displaced fracture involving the distal phalanx of the
fifth finger.

## 2016-12-24 DIAGNOSIS — Z8619 Personal history of other infectious and parasitic diseases: Secondary | ICD-10-CM

## 2016-12-24 HISTORY — DX: Personal history of other infectious and parasitic diseases: Z86.19

## 2017-07-04 ENCOUNTER — Ambulatory Visit (HOSPITAL_COMMUNITY)
Admission: EM | Admit: 2017-07-04 | Discharge: 2017-07-04 | Disposition: A | Discharge status: Home / Self Care | Payer: Worker's Compensation | Attending: Emergency Medicine | Admitting: Emergency Medicine

## 2017-07-04 ENCOUNTER — Encounter (HOSPITAL_COMMUNITY): Payer: Self-pay | Admitting: Family Medicine

## 2017-07-04 DIAGNOSIS — M545 Low back pain, unspecified: Secondary | ICD-10-CM

## 2017-07-04 MED ORDER — KETOROLAC TROMETHAMINE 30 MG/ML IJ SOLN
30.0000 mg | Freq: Once | INTRAMUSCULAR | Status: AC
  Administered 2017-07-04: 30 mg via INTRAMUSCULAR

## 2017-07-04 MED ORDER — CYCLOBENZAPRINE HCL 10 MG PO TABS: 5.0000 mg | ORAL_TABLET | Freq: Every day | ORAL | 0 refills | Status: DC

## 2017-07-04 MED ORDER — NAPROXEN 500 MG PO TABS: 500.0000 mg | ORAL_TABLET | Freq: Two times a day (BID) | ORAL | 0 refills | Status: AC

## 2017-07-04 MED ORDER — KETOROLAC TROMETHAMINE 30 MG/ML IJ SOLN
INTRAMUSCULAR | Status: AC
  Filled 2017-07-04: qty 1
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
## 2017-07-04 NOTE — ED Provider Notes (Signed)
[facility]    CSN: [id number] Arrival date & time: 07/04/17  1619     History   Chief Complaint Chief Complaint  Patient presents with  . Back Pain    HPI Natalie Colon is a 36 y.o. female.   36 year old female comes in for 3 day history of back pain. 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 She states she is experiencing mid lower back pain that radiates to bilateral back muscles. She has taken ibuprofen 800 mg twice since the back pain started. Denies numbness, tingling, loss of bladder or bowel control. States pain is constant, and worsens with movement, she is unable to feel comfortable. She has tried back brace without improvement.      Past Medical History:  Diagnosis Date  . ADHD (attention deficit hyperactivity disorder)   . Asthma   . Depression   . Herpes   . Mitral valve prolapse 2010   [place]    Patient Active Problem List   Diagnosis Date Noted  . Chest pain 05/20/2012    Past Surgical History:  Procedure Laterality Date  . CYSTECTOMY     Neck  . Pilonidal cyst removal    . Tubal implant procedure      OB History    No data available       Home Medications    Prior to Admission medications   Medication Sig Start Date End Date Taking? Authorizing Provider  cyclobenzaprine (FLEXERIL) 10 MG tablet Take 0.5-1 tablets (5-10 mg total) by mouth at bedtime. 07/04/17   Cathie HoopsYu, [place] V, PA-C  fexofenadine (ALLEGRA) 180 MG tablet Take 1 tablet (180 mg total) by mouth daily. 06/11/13   Linna HoffKindl, James D, MD  Lactobacillus-Inulin (CULTURELLE DIGESTIVE HEALTH PO) Take by mouth.    [provider]  mometasone (NASONEX) 50 MCG/ACT nasal spray Place 2 sprays into the nose daily.    [provider]  naproxen (NAPROSYN) 500 MG tablet Take 1 tablet (500 mg total) by mouth 2 (two) times daily. 07/04/17 07/14/17  Belinda FisherYu, Ragna Kramlich V, PA-C  traMADol (ULTRAM) 50 MG tablet Take 1 tablet (50 mg total) by mouth  every 6 (six) hours as needed. 01/13/15   Cartner, Sharlet SalinaBenjamin, PA-C  valACYclovir (VALTREX) 500 MG tablet Take 500 mg by mouth 2 (two) times daily.    [provider]  Vortioxetine HBr (BRINTELLIX) 5 MG TABS Take by mouth daily.    [provider]    Family History Family History  Problem Relation Age of Onset  . Heart attack Maternal Grandmother   . COPD Maternal Grandmother   . COPD Mother   . Hypertension Mother   . Heart disease Maternal Grandfather   . COPD Maternal Grandfather     Social History Social History  Substance Use Topics  . Smoking status: Current Some Day Smoker    Packs/day: 0.20    Years: 10.00    Types: Cigarettes  . Smokeless tobacco: Not on file  . Alcohol use 0.5 oz/week    1 Standard drinks or equivalent per week     Comment: occasion     Allergies   Hydrocodone-acetaminophen; Latex; Penicillins; and Vicodin [hydrocodone-acetaminophen]   Review of Systems Review of Systems  Reason unable to perform ROS: See HPI as above.     Physical Exam Triage Vital Signs ED Triage Vitals  Enc Vitals Group     BP 07/04/17 1700 114/77  Pulse Rate 07/04/17 1700 94     Resp 07/04/17 1700 18     Temp --      Temp src --      SpO2 07/04/17 1700 99 %     Weight --      Height --      Head Circumference --      Peak Flow --      Pain Score 07/04/17 1658 10     Pain Loc --      Pain Edu? --      Excl. in GC? --    No data found.   Updated Vital Signs BP 114/77   Pulse 94   Resp 18   LMP 06/03/2017   SpO2 99%      Physical Exam  Constitutional: She is oriented to person, place, and time. She appears well-developed and well-nourished. No distress.  HENT:  Head: Normocephalic and atraumatic.  Eyes: Pupils are equal, round, and reactive to light. Conjunctivae are normal.  Cardiovascular: Normal rate, regular rhythm and normal heart sounds.  Exam reveals no gallop and no friction rub.   No murmur heard. Pulmonary/Chest:  Effort normal and breath sounds normal. She has no wheezes. She has no rales.  Musculoskeletal:  Tenderness to palpation of bilateral lumbar region, paraspinal muscles. Patient declined range of motion testing due to pain. She was unable to lay flat due to the pain, was unable to test hip strength. Negative straight leg raise.   Neurological: She is alert and oriented to person, place, and time.  Skin: Skin is warm and dry.     UC Treatments / Results  Labs (all labs ordered are listed, but only abnormal results are displayed) Labs Reviewed - No data to display  EKG  EKG Interpretation None       Radiology No results found.  Procedures Procedures (including critical care time)  Medications Ordered in UC Medications  ketorolac (TORADOL) 30 MG/ML injection 30 mg (30 mg Intramuscular Given 07/04/17 1749)     Initial Impression / Assessment and Plan / UC Course  I have reviewed the triage vital signs and the nursing notes.  Pertinent labs & imaging results that were available during my care of the patient were reviewed by me and considered in my medical decision making (see chart for details).  Toradol injection in office. Start NSAID as directed for pain and inflammation. Muscle relaxant as needed. Ice/heat compresses. Discussed with patient this can take up to 3-4 weeks to resolve, but should be getting better each week. Patient to follow up with occ health for reevaluation of back pain and clearance for work. Return precautions given.    Final Clinical Impressions(s) / UC Diagnoses   Final diagnoses:  Acute bilateral low back pain without sciatica    New Prescriptions Discharge Medication List as of 07/04/2017  5:48 PM    START taking these medications   Details  naproxen (NAPROSYN) 500 MG tablet Take 1 tablet (500 mg total) by mouth 2 (two) times daily., Starting Fri 07/04/2017, Until Mon 07/14/2017, Normal           Linward Headland V, PA-C 07/04/17 [id number]

## 2017-07-04 NOTE — Discharge Instructions (Signed)
You were given a toradol injection in office. Start Flexeril as directed. Flexeril as needed at night. Flexeril can make you drowsy, so do not take if you are going to drive, operate heavy machinery, or make important decisions. Ice/heat compresses as needed. This can take up to 3-4 weeks to completely resolve, but you should be feeling better each week. Follow up with occupational health on Monday for reevaluation and clearance for work. If experience numbness/tingling of the inner thighs, loss of bladder or bowel control, go to the emergency department for evaluation.

## 2017-07-14 ENCOUNTER — Other Ambulatory Visit: Payer: Self-pay | Admitting: Occupational Medicine

## 2017-07-14 ENCOUNTER — Ambulatory Visit: Payer: Self-pay

## 2017-07-14 DIAGNOSIS — M545 Low back pain: Secondary | ICD-10-CM

## 2017-10-30 ENCOUNTER — Telehealth: Payer: Self-pay | Admitting: *Deleted

## 2017-10-30 ENCOUNTER — Telehealth: Payer: Self-pay | Admitting: Cardiovascular Disease

## 2017-10-30 NOTE — Telephone Encounter (Signed)
I spoke with pt. She is asking if she has been released from cardiac care. I told her she has not been released and she was last seen in August 2015 with 2 year follow up planned.  Pt then put me on speaker phone to speak with provider who is seeing pt for functional capacity evaluation. Provider reports they need cardiac clearance for this.  Provider will send this clearance to our office.

## 2017-10-30 NOTE — Telephone Encounter (Signed)
Medical Clearance Request received in office from [facility].  I spoke with pt and told her form could not be completed until she was seen again in office. She has billing concerns and I offered to have billing department contact her to discuss.  Pt will call us back and let us know how she would like to proceed.

## 2017-10-30 NOTE — Telephone Encounter (Signed)
Patient calling, currently at rehab and the clinic would like to know if she was released from Cardiac Care in 2013.

## 2018-01-02 ENCOUNTER — Other Ambulatory Visit: Payer: Self-pay

## 2018-01-02 ENCOUNTER — Emergency Department (HOSPITAL_COMMUNITY)
Admission: EM | Admit: 2018-01-02 | Discharge: 2018-01-02 | Disposition: A | Discharge status: Home / Self Care | Payer: Medicaid Other | Attending: Emergency Medicine | Admitting: Emergency Medicine

## 2018-01-02 ENCOUNTER — Emergency Department (HOSPITAL_COMMUNITY): Payer: Medicaid Other

## 2018-01-02 ENCOUNTER — Encounter (HOSPITAL_COMMUNITY): Payer: Self-pay | Admitting: Emergency Medicine

## 2018-01-02 DIAGNOSIS — R079 Chest pain, unspecified: Secondary | ICD-10-CM | POA: Diagnosis not present

## 2018-01-02 DIAGNOSIS — F17210 Nicotine dependence, cigarettes, uncomplicated: Secondary | ICD-10-CM | POA: Insufficient documentation

## 2018-01-02 DIAGNOSIS — Z91040 Latex allergy status: Secondary | ICD-10-CM | POA: Diagnosis not present

## 2018-01-02 DIAGNOSIS — J45909 Unspecified asthma, uncomplicated: Secondary | ICD-10-CM | POA: Insufficient documentation

## 2018-01-02 DIAGNOSIS — Z79899 Other long term (current) drug therapy: Secondary | ICD-10-CM | POA: Insufficient documentation

## 2018-01-02 LAB — I-STAT BETA HCG BLOOD, ED (MC, WL, AP ONLY): I-stat hCG, quantitative: <5 m[IU]/mL (ref <5)

## 2018-01-02 LAB — BASIC METABOLIC PANEL
Anion gap: 8 (ref 5–15)
BUN: 12 mg/dL (ref 6–20)
CO2: 24 mmol/L (ref 22–32)
Calcium: 8.9 mg/dL (ref 8.9–10.3)
Chloride: 108 mmol/L (ref 101–111)
Creatinine, Ser: 0.84 mg/dL (ref 0.44–1.00)
GFR calc Af Amer: >60 mL/min (ref >60)
GFR calc non Af Amer: >60 mL/min (ref >60)
Glucose, Bld: 91 mg/dL (ref 65–99)
Potassium: 3.5 mmol/L (ref 3.5–5.1)
Sodium: 140 mmol/L (ref 135–145)

## 2018-01-02 LAB — I-STAT TROPONIN, ED: Troponin i, poc: 0 ng/mL (ref 0.00–0.08)

## 2018-01-02 LAB — CBC
HEMATOCRIT: 36.8 % (ref 36.0–46.0)
Hemoglobin: 11.7 g/dL — ABNORMAL LOW (ref 12.0–15.0)
MCH: 26.6 pg (ref 26.0–34.0)
MCHC: 31.8 g/dL (ref 30.0–36.0)
MCV: 83.6 fL (ref 78.0–100.0)
Platelets: 324 10*3/uL (ref 150–400)
RBC: 4.4 MIL/uL (ref 3.87–5.11)
RDW: 13.4 % (ref 11.5–15.5)
WBC: 6.8 10*3/uL (ref 4.0–10.5)

## 2018-01-02 NOTE — ED Triage Notes (Signed)
Reports central cp describes as sharp and pain in both arms.  Worse in right.  Hx of same.  Also reports history of black outs for the last year or so and history of Mitral valve prolapse.

## 2018-01-02 NOTE — ED Provider Notes (Signed)
[facility] EMERGENCY DEPARTMENT Provider Note   CSN: [id number] Arrival date & time: 01/02/18  2123     History   Chief Complaint Chief Complaint  Patient presents with  . Chest Pain  . Arm Pain    HPI Natalie Colon is a 37 y.o. female.  Patient presents to the emergency department for evaluation of chest pain.  She has been having episodes of chest pain over the last week or more.  Pain comes on for unknown reasons.  It can wake her from sleep, occur when she has at rest.  She has not noticed any exertional pain.  She does report that she is under stress.  She has had similar pains in the past, has seen cardiology but lost her insurance and has not followed up for approximately 4 years.  Patient does not have associated nausea, vomiting, diaphoresis, shortness of breath.      Past Medical History:  Diagnosis Date  . ADHD (attention deficit hyperactivity disorder)   . Asthma   . Depression   . Herpes   . Mitral valve prolapse 2010   Echo and workup in [place]    Patient Active Problem List   Diagnosis Date Noted  . Chest pain 05/20/2012    Past Surgical History:  Procedure Laterality Date  . CYSTECTOMY     Neck  . Pilonidal cyst removal    . Tubal implant procedure      OB History    No data available       Home Medications    Prior to Admission medications   Medication Sig Start Date End Date Taking? Authorizing Provider  cyclobenzaprine (FLEXERIL) 10 MG tablet Take 0.5-1 tablets (5-10 mg total) by mouth at bedtime. 07/04/17   Cathie Hoops, Amy V, PA-C  fexofenadine (ALLEGRA) 180 MG tablet Take 1 tablet (180 mg total) by mouth daily. 06/11/13   Linna Hoff, MD  Lactobacillus-Inulin (CULTURELLE DIGESTIVE HEALTH PO) Take by mouth.    [provider]  mometasone (NASONEX) 50 MCG/ACT nasal spray Place 2 sprays into the nose daily.    [provider]  traMADol (ULTRAM) 50 MG tablet Take 1 tablet (50 mg total) by mouth every 6  (six) hours as needed. 01/13/15   Cartner, Sharlet Salina, PA-C  valACYclovir (VALTREX) 500 MG tablet Take 500 mg by mouth 2 (two) times daily.    [provider]  Vortioxetine HBr (BRINTELLIX) 5 MG TABS Take by mouth daily.    [provider]    Family History Family History  Problem Relation Age of Onset  . Heart attack Maternal Grandmother   . COPD Maternal Grandmother   . COPD Mother   . Hypertension Mother   . Heart disease Maternal Grandfather   . COPD Maternal Grandfather     Social History Social History   Tobacco Use  . Smoking status: Current Some Day Smoker    Packs/day: 0.20    Years: 10.00    Pack years: 2.00    Types: Cigarettes  . Smokeless tobacco: Never Used  Substance Use Topics  . Alcohol use: Yes    Alcohol/week: 0.5 oz    Types: 1 Standard drinks or equivalent per week    Comment: occasion  . Drug use: No     Allergies   Hydrocodone-acetaminophen; Latex; Penicillins; and Vicodin [hydrocodone-acetaminophen]   Review of Systems Review of Systems  Cardiovascular: Positive for chest pain.  All other systems reviewed and are negative.  Physical Exam Updated Vital Signs BP 112/84   Pulse 68   Temp 98.5 F (36.9 C) (Oral)   Resp 15   Ht 5\' 7"  (1.702 m)   Wt 72.6 kg (160 lb)   LMP 12/12/2017 (Exact Date) Comment: pt sheilded  SpO2 100%   BMI 25.06 kg/m   Physical Exam  Constitutional: She is oriented to person, place, and time. She appears well-developed and well-nourished. No distress.  HENT:  Head: Normocephalic and atraumatic.  Right Ear: Hearing normal.  Left Ear: Hearing normal.  Nose: Nose normal.  Mouth/Throat: Oropharynx is clear and moist and mucous membranes are normal.  Eyes: Conjunctivae and EOM are normal. Pupils are equal, round, and reactive to light.  Neck: Normal range of motion. Neck supple.  Cardiovascular: Regular rhythm, S1 normal and S2 normal. Exam reveals no gallop and no friction rub.  No murmur  heard. Pulmonary/Chest: Effort normal and breath sounds normal. No respiratory distress. She exhibits no tenderness.  Abdominal: Soft. Normal appearance and bowel sounds are normal. There is no hepatosplenomegaly. There is no tenderness. There is no rebound, no guarding, no tenderness at McBurney's point and negative Murphy's sign. No hernia.  Musculoskeletal: Normal range of motion.  Neurological: She is alert and oriented to person, place, and time. She has normal strength. No cranial nerve deficit or sensory deficit. Coordination normal. GCS eye subscore is 4. GCS verbal subscore is 5. GCS motor subscore is 6.  Skin: Skin is warm, dry and intact. No rash noted. No cyanosis.  Psychiatric: She has a normal mood and affect. Her speech is normal and behavior is normal. Thought content normal.  Nursing note and vitals reviewed.    ED Treatments / Results  Labs (all labs ordered are listed, but only abnormal results are displayed) Labs Reviewed  CBC - Abnormal; Notable for the following components:      Result Value   Hemoglobin 11.7 (*)    All other components within normal limits  BASIC METABOLIC PANEL  I-STAT TROPONIN, ED  I-STAT BETA HCG BLOOD, ED ([facility], [facility], [facility] ONLY)    EKG  EKG Interpretation  Date/Time:  Friday January 02 2018 21:41:28 EST Ventricular Rate:  75 PR Interval:  122 QRS Duration: 78 QT Interval:  404 QTC Calculation: 451 R Axis:   23 Text Interpretation:  Normal sinus rhythm Low voltage QRS Cannot rule out Anterior infarct , age undetermined Abnormal ECG No significant change since last tracing Confirmed by Gilda CreasePollina, Christopher J [phone]([id number]) on 01/02/2018 11:01:50 PM       Radiology Dg Chest 2 View  Result Date: 01/02/2018 CLINICAL DATA:  37 y/o F; central chest pain and bilateral arm pain. EXAM: CHEST - 2 VIEW COMPARISON:  02/22/2014 chest radiograph. FINDINGS: Stable heart size and mediastinal contours are within normal limits. Both lungs are clear. The visualized  skeletal structures are unremarkable. IMPRESSION: No active cardiopulmonary disease. Electronically Signed   By: Mitzi HansenLance  Furusawa-Stratton M.D.   On: 01/02/2018 22:01    Procedures Procedures (including critical care time)  Medications Ordered in ED Medications - No data to display   Initial Impression / Assessment and Plan / ED Course  I have reviewed the triage vital signs and the nursing notes.  Pertinent labs & imaging results that were available during my care of the patient were reviewed by me and considered in my medical decision making (see chart for details).     Patient presents to the ER for evaluation of chest pain.  Pain is  atypical in nature, occurs at rest or even while she sleeps.  She does have a history of chest pain that was determined to be noncardiac in the past.  She was followed by cardiology up until 4 years ago.  This was primarily because of mild mitral valve prolapse.  She had chest pain workup which included echo and stress test in 2015, no abnormalities were noted.  She does have a slightly chronically abnormal EKG with inverted T waves inferiorly and poor R wave progression anteriorly.  This is seen today without any changes.  Troponin is negative.  As patient's pain is very atypical, she has minimal cardiac risk factors and a reasonably normal workup in the recent past, she does not require hospitalization.  Patient has a follow-up appointment with cardiology scheduled in 4 days.  Final Clinical Impressions(s) / ED Diagnoses   Final diagnoses:  Chest pain, unspecified type    ED Discharge Orders    None       Gilda Crease, MD 01/02/18 2319

## 2018-01-06 NOTE — Progress Notes (Signed)
Chief Complaint  Patient presents with  . Follow-up    mitral regurgitation   History of Present Illness: 37 yo female with history of tobacco abuse, mitral valve prolapse, ADHD and depression who is here today for cardiac follow up. I saw her as a new patient for evaluation of chest pain on 05/20/12. She had been [facility] 04/23/12 with c/o chest pain. Her EKG 04/23/12 showed inverted T waves inferior leads, poor R wave progression through the precordial leads, NSR. Troponin negative. I arranged an exercise stress test and an echo. 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 She had no EKG changes c/w ischemia during exercise stress test. I last saw her in August 2015. She had work related back trauma in September 2018. She has recovered. Seen in ED 01/02/18 with chest pain.  Troponin negative.   She is here today for follow up. The patient denies any dyspnea, palpitations, lower extremity edema, orthopnea, PND, dizziness, near syncope or syncope. She does have chest pain at rest. Radiates down both arms. Never occurs with exertion. No dyspnea with exertion.    Primary Care Physician: System, Provider Not In   Past Medical History:  Diagnosis Date  . ADHD (attention deficit hyperactivity disorder)   . Asthma   . Depression   . Herpes   . Mitral valve prolapse 2010   [place]    Past Surgical History:  Procedure Laterality Date  . CYSTECTOMY     Neck  . Pilonidal cyst removal    . Tubal implant procedure      Current Outpatient Medications  Medication Sig Dispense Refill  . fexofenadine (ALLEGRA) 180 MG tablet Take 1 tablet (180 mg total) by mouth daily. 30 tablet 1  . mometasone (NASONEX) 50 MCG/ACT nasal spray Place 2 sprays into the nose daily.    . valACYclovir (VALTREX) 500 MG tablet Take 500 mg by mouth 2 (two) times daily.    . metoprolol  tartrate (LOPRESSOR) 50 MG tablet Take one tablet by mouth one hour prior to CT Scan 1 tablet 0   No current facility-administered medications for this visit.     Allergies  Allergen Reactions  . Hydrocodone-Acetaminophen   . Latex Itching  . Penicillins Other (See Comments)    unknown  . Vicodin [Hydrocodone-Acetaminophen] Other (See Comments)    Heart racing    Social History   Socioeconomic History  . Marital status: Single    Spouse name: Not on file  . Number of children: 3  . Years of education: Not on file  . Highest education level: Not on file  Social Needs  . Financial resource strain: Not on file  . Food insecurity - worry: Not on file  . Food insecurity - inability: Not on file  . Transportation needs - medical: Not on file  . Transportation needs - non-medical: Not on file  Occupational History  . 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 Smoking status: Current Some Day Smoker    Packs/day: 0.20    Years: 10.00    Pack years: 2.00    Types: Cigarettes  . Smokeless tobacco: Never Used  Substance and Sexual Activity  . Alcohol use: Yes    Alcohol/week: 0.5 oz    Types: 1 Standard drinks or equivalent per week    Comment: occasion  .  Drug use: No  . Sexual activity: Yes    Birth control/protection: Other-see comments  Other Topics Concern  . Not on file  Social History Narrative  . Not on file    Family History  Problem Relation Age of Onset  . Heart attack Maternal Grandmother   . COPD Maternal Grandmother   . COPD Mother   . Hypertension Mother   . Heart disease Maternal Grandfather   . COPD Maternal Grandfather     Review of Systems:  As stated in the HPI and otherwise negative.   BP 108/70   Pulse 89   Ht 5\' 7"  (1.702 m)   Wt 182 lb 3.2 oz (82.6 kg)   LMP 12/12/2017 (Exact Date) Comment: pt sheilded  SpO2 98%   BMI 28.54 kg/m   Physical Examination:  General: Well developed, well nourished, NAD  HEENT: OP clear,  mucus membranes moist  SKIN: warm, dry. No rashes. Neuro: No focal deficits  Musculoskeletal: Muscle strength 5/5 all ext  Psychiatric: Mood and affect normal  Neck: No JVD, no carotid bruits, no thyromegaly, no lymphadenopathy.  Lungs:Clear bilaterally, no wheezes, rhonci, crackles Cardiovascular: Regular rate and rhythm. No murmurs, gallops or rubs. Abdomen:Soft. Bowel sounds present. Non-tender.  Extremities: No lower extremity edema. Pulses are 2 + in the bilateral DP/PT.  EKG:  EKG is not ordered today. The ekg from 01/03/18 is reviewed today and shows sinus, low voltage and chronic poor R wave progression.    Recent Labs: 01/02/2018: BUN 12; Creatinine, Ser 0.84; Hemoglobin 11.7; Platelets 324; Potassium 3.5; Sodium 140   Lipid Panel No results found for: CHOL, TRIG, HDL, CHOLHDL, VLDL, LDLCALC, LDLDIRECT   Wt Readings from Last 3 Encounters:  01/07/18 182 lb 3.2 oz (82.6 kg)  01/02/18 160 lb (72.6 kg)  01/13/15 160 lb (72.6 kg)     Other studies Reviewed: Additional studies/ records that were reviewed today include: . Review of the above records demonstrates:   Echo 07/01/14: Left ventricle: The cavity size was normal. Systolic function was normal. The estimated ejection fraction was in the range of 55% to 60%. Wall motion was normal; there were no regional wall motion abnormalities. - Mitral valve: Mild prolapse, involving the posterior leaflet. There was mild regurgitation.  Assessment and Plan:   1. Chest pain: She has had chest pain in the past that has been felt to be musculoskeletal. She continues to have chest pain several days per week. Nuclear stress test without evidence of ischemia in 2013. Echo with normal LV function in 2013. I will arrange a cardiac CTA to exclude CAD. One dose of Lopressor day of test.   2. Mitral valve prolapse: Echo September 2015 with MV prolapse, mild MR. Normal LV size and function. I will repeat an echo now to assess.   If  tests ok, will clear to return to work. She needs these tests done before she can be cleared through her workers comp plan.    Current medicines are reviewed at length with the patient today.  The patient does not have concerns regarding medicines.  The following changes have been made:  no change  Labs/ tests ordered today include:   Orders Placed This Encounter  Procedures  . CT CORONARY MORPH W/CTA COR W/SCORE W/CA W/CM &/OR WO/CM  . CT CORONARY FRACTIONAL FLOW RESERVE DATA PREP  . CT CORONARY FRACTIONAL FLOW RESERVE FLUID ANALYSIS  . ECHOCARDIOGRAM COMPLETE     Disposition:   FU with me or office APP on  my care team in 3-4 weeks.    Signed, Verne Carrow, MD 01/07/2018 9:26 AM    [facility] [address] [place], [place], [place]  [address] Phone: [phone]; Fax: [phone]

## 2018-01-07 ENCOUNTER — Ambulatory Visit (INDEPENDENT_AMBULATORY_CARE_PROVIDER_SITE_OTHER): Payer: Worker's Compensation | Admitting: Cardiovascular Disease

## 2018-01-07 ENCOUNTER — Encounter: Payer: Self-pay | Admitting: Cardiovascular Disease

## 2018-01-07 VITALS — BP 108/70 | HR 89 | Ht 67.0 in | Wt 182.2 lb

## 2018-01-07 DIAGNOSIS — R072 Precordial pain: Secondary | ICD-10-CM

## 2018-01-07 DIAGNOSIS — I340 Nonrheumatic mitral (valve) insufficiency: Secondary | ICD-10-CM | POA: Diagnosis not present

## 2018-01-07 MED ORDER — METOPROLOL TARTRATE 50 MG PO TABS: ORAL_TABLET | ORAL | 0 refills | Status: DC

## 2018-01-07 NOTE — Patient Instructions (Addendum)
Medication Instructions:  Your physician recommends that you continue on your current medications as directed. Please refer to the Current Medication list given to you today.   Labwork: none  Testing/Procedures: Your physician has requested that you have an echocardiogram. Echocardiography is a painless test that uses sound waves to create images of your heart. It provides your doctor with information about the size and shape of your heart and how well your heart's chambers and valves are working. This procedure takes approximately one hour. There are no restrictions for this procedure.  Your physician has requested that you have cardiac CT. Cardiac computed tomography (CT) is a painless test that uses an x-ray machine to take clear, detailed pictures of your heart. For further information please visit [email or web address]. Please follow instructions below    Follow-Up: Your physician recommends that you schedule a follow-up appointment in: about 3-4 weeks with PA or NP  (after tests done)    Any Other Special Instructions Will Be Listed Below (If Applicable).   Please arrive at the [facility] main entrance of [facility] at  AM (30-45 minutes prior to test start time)--We will call you with appointment time.   [facility] [address] [place], [place] [address] [phone]  Proceed to the [facility] Radiology Department (First Floor).  Please follow these instructions carefully (unless otherwise directed):    On the Night Before the Test: . Drink plenty of water. . Do not consume any caffeinated/decaffeinated beverages or chocolate 12 hours prior to your test. . Do not take any antihistamines 12 hours prior to your test.  On the Day of the Test: . Drink plenty of water. Do not drink any water within one hour of the test. . Do not eat any food 4 hours prior to the test. . You may take your regular medications prior to the test. . Take 50 mg of  lopressor (metoprolol) one hour before the test.  After the Test: . Drink plenty of water. . After receiving IV contrast, you may experience a mild flushed feeling. This is normal. . On occasion, you may experience a mild rash up to 24 hours after the test. This is not dangerous. If this occurs, you can take Benadryl 25 mg and increase your fluid intake. . If you experience trouble breathing, this can be serious. If it is severe call 911 IMMEDIATELY. If it is mild, please call our office.  If you need a refill on your cardiac medications before your next appointment, please call your pharmacy.

## 2018-01-09 ENCOUNTER — Other Ambulatory Visit (HOSPITAL_COMMUNITY): Payer: Self-pay

## 2018-01-20 ENCOUNTER — Ambulatory Visit (HOSPITAL_COMMUNITY): Payer: Medicaid Other | Attending: Cardiology

## 2018-01-20 ENCOUNTER — Other Ambulatory Visit: Payer: Self-pay

## 2018-01-20 DIAGNOSIS — I051 Rheumatic mitral insufficiency: Secondary | ICD-10-CM | POA: Insufficient documentation

## 2018-01-20 DIAGNOSIS — I340 Nonrheumatic mitral (valve) insufficiency: Secondary | ICD-10-CM

## 2018-01-21 ENCOUNTER — Telehealth: Payer: Self-pay | Admitting: Cardiovascular Disease

## 2018-01-21 NOTE — Telephone Encounter (Signed)
I spoke with Natalie Colon and reviewed echo results with her.  I told her our precert department had been trying to reach case manager but had been unable to speak with them.Natalie Colon reports workman's comp will not cover CT as it was not related to her work injury.   Natalie Colon does not want to have CT scan done. She states she thinks her chest pain is related to anxiety.  She is asking if Dr. Clifton JamesMcAlhany will clear her to have functional capacity testing done and then return to work.

## 2018-01-21 NOTE — Telephone Encounter (Signed)
Patient was in the office on Tuesday for her Echo - I spoke with her regarding her Ct- that we had not schedule yet due to the her case worker for [employer] - has not approve it yet.  She stated that she spoke with them and they stated that they would not cover the echo and the cardiac ct due to it had nothing to due to her injury.   I told her that the Echo she was having was precert was done with Medicaid.   She stated that she didn't know if she need the Ct and wanted to be cleared to return to work.  I told her that I would send the message to her doctor and they will call her back to discuss her going back to work.  I reach out to [place] in [employer] and she stated that she had left several messages to the case worker with no response from them regarding claim.

## 2018-01-21 NOTE — Telephone Encounter (Signed)
I think she can have functional capacity testing done. Do I need to sign anything? Natalie Colon

## 2018-01-21 NOTE — Telephone Encounter (Signed)
I spoke with pt and gave her information from Dr. Clifton JamesMcAlhany. There is no paperwork to complete and pt requests I contact Janelle --[phone] to discuss.  I placed call and left message on Janelle's voicemail to call office.

## 2018-02-02 NOTE — Telephone Encounter (Signed)
I have not heard back from GravityJanelle.  Pt is scheduled for appointment in our office tomorrow.  I placed call to pt to discuss and left message to call back.

## 2018-02-02 NOTE — Telephone Encounter (Signed)
I spoke with pt and told her we have been unable to reach Phil[place]Janelle. I told her Dr. Clifton JamesMcAlhany has said she can have functional capacity testing done. Pt will contact Janelle and ask her to contact our office. Pt has concerns regarding billing statement she received for echocardiogram.  I explained to pt there is a facility charge and a charge for the physician who read the study.  I will ask billing department call pt to discuss. Pt reports she is not having any chest pain and thinks it was related to anxiety.  She does not have any questions regarding treatment at this time. I asked her if she would like to come in tomorrow for appointment as planned. As she has no questions she does not think appointment is needed.  Will cancel tomorrow's appointment. Will forward to Dr. Clifton JamesMcAlhany regarding future follow up.

## 2018-02-02 NOTE — Progress Notes (Deleted)
Cardiology Office Note    Date:  02/02/2018   ID:  Natalie Colon, DOB 1980/11/17, MRN [id number]  PCP:  System, Provider Not In  Cardiologist: Verne Carrowhristopher McAlhany, MD  No chief complaint on file.   History of Present Illness:  Natalie Colon is a 37 y.o. female with history of MVP, tobacco abuse and ADHD as well as depression.  She saw Dr. Clifton JamesMcAlhany in 2013 for chest pain and EKG showed inverted T waves inferiorly poor R wave progression in the precordial leads.  Troponins were negative.  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  She was seen in the ED 01/02/18 with chest pain troponins were negative.    Last saw Dr. Clifton JamesMcAlhany 01/07/18 who ordered a cardiac CTA to exclude CAD but workman's comp will not pay for it so she asked that she have a functional study done instead.  2D echo 01/20/18 normal LVEF 55-60% with MVP and mild MR.    Past Medical History:  Diagnosis Date  . ADHD (attention deficit hyperactivity disorder)   . Asthma   . Depression   . Herpes   . Mitral valve prolapse 2010   [place] in [place]    Past Surgical History:  Procedure Laterality Date  . CYSTECTOMY     Neck  . Pilonidal cyst removal    . Tubal implant procedure      Current Medications: No outpatient medications have been marked as taking for the 02/03/18 encounter (Appointment) with Dyann KiefLenze, Michele M, PA-C.     Allergies:   Hydrocodone-acetaminophen; Latex; Penicillins; and Vicodin [hydrocodone-acetaminophen]   Social History   Socioeconomic History  . Marital status: Single    Spouse name: Not on file  . Number of children: 3  . Years of education: Not on file  . Highest education level: Not on file  Occupational History  . 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 Financial resource strain: Not on file  . Food insecurity:    Worry: Not on file    Inability: Not on file   . Transportation needs:    Medical: Not on file    Non-medical: Not on file  Tobacco Use  . Smoking status: Current Some Day Smoker    Packs/day: 0.20    Years: 10.00    Pack years: 2.00    Types: Cigarettes  . Smokeless tobacco: Never Used  Substance and Sexual Activity  . Alcohol use: Yes    Alcohol/week: 0.5 oz    Types: 1 Standard drinks or equivalent per week    Comment: occasion  . Drug use: No  . Sexual activity: Yes    Birth control/protection: Other-see comments  Lifestyle  . Physical activity:    Days per week: Not on file    Minutes per session: Not on file  . Stress: Not on file  Relationships  . Social connections:    Talks on phone: Not on file    Gets together: Not on file    Attends religious service: Not on file    Active member of club or organization: Not on file    Attends meetings of clubs or organizations: Not on file    Relationship status: Not on file  Other Topics Concern  . Not on file  Social History Narrative  . Not on file     Family History:  The patient's ***  family history includes COPD in her maternal grandfather, maternal grandmother, and mother; Heart attack in her maternal grandmother; Heart disease in her maternal grandfather; Hypertension in her mother.   ROS:   Please see the history of present illness.    ROS All other systems reviewed and are negative.   PHYSICAL EXAM:   VS:  There were no vitals taken for this visit.  Physical Exam  GEN: Well nourished, well developed, in no acute distress  HEENT: normal  Neck: no JVD, carotid bruits, or masses Cardiac:RRR; no murmurs, rubs, or gallops  Respiratory:  clear to auscultation bilaterally, normal work of breathing GI: soft, nontender, nondistended, + BS Ext: without cyanosis, clubbing, or edema, Good distal pulses bilaterally MS: no deformity or atrophy  Skin: warm and dry, no rash Neuro:  Alert and Oriented x 3, Strength and sensation are intact Psych: euthymic mood, full  affect  Wt Readings from Last 3 Encounters:  01/07/18 182 lb 3.2 oz (82.6 kg)  01/02/18 160 lb (72.6 kg)  01/13/15 160 lb (72.6 kg)      Studies/Labs Reviewed:   EKG:  EKG is*** ordered today.  The ekg ordered today demonstrates ***  Recent Labs: 01/02/2018: BUN 12; Creatinine, Ser 0.84; Hemoglobin 11.7; Platelets 324; Potassium 3.5; Sodium 140   Lipid Panel No results found for: CHOL, TRIG, HDL, CHOLHDL, VLDL, LDLCALC, LDLDIRECT  Additional studies/ records that were reviewed today include:  2Decho 01/20/18 Study Conclusions   - Left ventricle: The cavity size was normal. Systolic function was   normal. The estimated ejection fraction was in the range of 55%   to 60%. Wall motion was normal; there were no regional wall   motion abnormalities. Left ventricular diastolic function   parameters were normal. - Mitral valve: Moderate, holosystolicprolapse, involving the   middle segment of the anterior leaflet and the middle scallop of   the posterior leaflet. There was mild regurgitation directed   centrally.      ASSESSMENT:    No diagnosis found.   PLAN:  In order of problems listed above:      Medication Adjustments/Labs and Tests Ordered: Current medicines are reviewed at length with the patient today.  Concerns regarding medicines are outlined above.  Medication changes, Labs and Tests ordered today are listed in the Patient Instructions below. There are no Patient Instructions on file for this visit.   Elson Clan, PA-C  02/02/2018 1:09 PM    [facility] [address] [place], [place], [place]  [address] Phone: [phone]; Fax: [phone]

## 2018-02-02 NOTE — Telephone Encounter (Signed)
Thanks Pat

## 2018-02-03 ENCOUNTER — Ambulatory Visit: Payer: Self-pay | Admitting: Physician Assistant

## 2018-02-03 NOTE — Telephone Encounter (Signed)
Can we put her in to see me for follow up in 2 years? She has mild valve disease. Natalie Colon

## 2018-02-04 ENCOUNTER — Telehealth: Payer: Self-pay | Admitting: *Deleted

## 2018-02-04 NOTE — Telephone Encounter (Signed)
I spoke with pt and let her know Dr. Clifton JamesMcAlhany would like to see her back in 2 years.

## 2018-02-04 NOTE — Telephone Encounter (Signed)
Received call transferred from operator and spoke with Janelle. She states [facility] needs letter from Dr. Clifton JamesMcAlhany stating pt can have functional capacity testing done. If there are any guidelines to follow or any physical restrictions this should be included in note. Janelle reports this testing is about 2 hours and includes treadmill test and other testing to see if pt has any permanent restrictions for her job. Note needs to be faxed to [facility]  [phone] Phone number for [facility] is [phone]

## 2018-02-04 NOTE — Telephone Encounter (Signed)
Left message to call back. Recall placed for 2 year follow up.

## 2018-02-04 NOTE — Telephone Encounter (Signed)
Follow up: ° ° ° °Patient returning your call. °

## 2018-02-05 ENCOUNTER — Encounter: Payer: Self-pay | Admitting: Cardiovascular Disease

## 2018-02-05 NOTE — Telephone Encounter (Signed)
See the letter I have written. Thanks, chris

## 2018-02-06 NOTE — Telephone Encounter (Signed)
Letter faxed.

## 2018-08-21 ENCOUNTER — Encounter (HOSPITAL_COMMUNITY): Payer: Self-pay

## 2018-08-21 ENCOUNTER — Other Ambulatory Visit: Payer: Self-pay

## 2018-08-21 ENCOUNTER — Ambulatory Visit (HOSPITAL_COMMUNITY)
Admission: EM | Admit: 2018-08-21 | Discharge: 2018-08-21 | Disposition: A | Discharge status: Home / Self Care | Payer: Worker's Compensation | Attending: Family Medicine | Admitting: Family Medicine

## 2018-08-21 DIAGNOSIS — G8929 Other chronic pain: Secondary | ICD-10-CM

## 2018-08-21 DIAGNOSIS — M545 Low back pain: Secondary | ICD-10-CM | POA: Diagnosis not present

## 2018-08-21 MED ORDER — NAPROXEN 500 MG PO TABS: 500.0000 mg | ORAL_TABLET | Freq: Two times a day (BID) | ORAL | 0 refills | Status: DC

## 2018-08-21 MED ORDER — CYCLOBENZAPRINE HCL 5 MG PO TABS: 5.0000 mg | ORAL_TABLET | Freq: Every day | ORAL | 0 refills | Status: DC

## 2018-08-21 NOTE — ED Triage Notes (Signed)
Pt back pain on going . Pt states the injury was a year ago.

## 2018-08-21 NOTE — Discharge Instructions (Addendum)
You need to follow-up with your personal doctor and orthopedist

## 2018-08-21 NOTE — ED Provider Notes (Signed)
[facility]    CSN: [id number] Arrival date & time: 08/21/18  1842     History   Chief Complaint Chief Complaint  Patient presents with  . Back Pain    HPI Natalie Colon is a 37 y.o. female.   This is a 37 year old woman coming in complaining of back pain.  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  She has not been back to work yet.  Her claim is in litigation.  Her back started hurting again several days ago.  Over the last year, patient is seen an orthopedist and was referred to physical therapy but patient states that the physical therapy made her back worse so she stopped.  Pain is located in the lower lumbar area, worse on the right.  She says she feels like there is a lump over the lower midline back.  She has no numbness, weakness, urinary problems, or fever.      Note from 07/04/2017:  37 year old female comes in for 3 day history of back pain. 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 She states she is experiencing mid lower back pain that radiates to bilateral back muscles. She has taken ibuprofen 800 mg twice since the back pain started. Denies numbness, tingling, loss of bladder or bowel control. States pain is constant, and worsens with movement, she is unable to feel comfortable. She has tried back brace without improvement.     Past Medical History:  Diagnosis Date  . ADHD (attention deficit hyperactivity disorder)   . Asthma   . Depression   . Herpes   . Mitral valve prolapse 2010   Echo and workup in [place]    Patient Active Problem List   Diagnosis Date Noted  . Chest pain 05/20/2012    Past Surgical History:  Procedure Laterality Date  . CYSTECTOMY     Neck  . Pilonidal cyst removal    . Tubal implant procedure      OB History   None      Home Medications    Prior to Admission medications   Medication Sig Start Date End Date Taking? Authorizing  Provider  cyclobenzaprine (FLEXERIL) 5 MG tablet Take 1 tablet (5 mg total) by mouth at bedtime. 08/21/18   Elvina Sidle, MD  fexofenadine (ALLEGRA) 180 MG tablet Take 1 tablet (180 mg total) by mouth daily. 06/11/13   Linna Hoff, MD  metoprolol tartrate (LOPRESSOR) 50 MG tablet Take one tablet by mouth one hour prior to CT Scan 01/07/18   Kathleene Hazel, MD  mometasone (NASONEX) 50 MCG/ACT nasal spray Place 2 sprays into the nose daily.    [provider]  naproxen (NAPROSYN) 500 MG tablet Take 1 tablet (500 mg total) by mouth 2 (two) times daily. 08/21/18   Elvina Sidle, MD  valACYclovir (VALTREX) 500 MG tablet Take 500 mg by mouth 2 (two) times daily.    [provider]    Family History Family History  Problem Relation Age of Onset  . Heart attack Maternal Grandmother   . COPD Maternal Grandmother   . COPD Mother   . Hypertension Mother   . Heart disease Maternal Grandfather   . COPD Maternal Grandfather     Social History Social History   Tobacco Use  . Smoking status: Current Some Day Smoker    Packs/day: 0.20    Years: 10.00  Pack years: 2.00    Types: Cigarettes  . Smokeless tobacco: Never Used  Substance Use Topics  . Alcohol use: Yes    Alcohol/week: 1.0 standard drinks    Types: 1 Standard drinks or equivalent per week    Comment: occasion  . Drug use: No     Allergies   Hydrocodone-acetaminophen; Latex; Penicillins; and Vicodin [hydrocodone-acetaminophen]   Review of Systems Review of Systems  Musculoskeletal: Positive for back pain.  All other systems reviewed and are negative.    Physical Exam Triage Vital Signs ED Triage Vitals  Enc Vitals Group     BP 08/21/18 1902 119/76     Pulse Rate 08/21/18 1902 69     Resp 08/21/18 1902 18     Temp 08/21/18 1902 97.8 F (36.6 C)     Temp src --      SpO2 08/21/18 1902 99 %     Weight 08/21/18 1906 143 lb (64.9 kg)     Height --      Head Circumference --       Peak Flow --      Pain Score 08/21/18 1904 8     Pain Loc --      Pain Edu? --      Excl. in GC? --    No data found.  Updated Vital Signs BP 119/76 (BP Location: Right Arm)   Pulse 69   Temp 97.8 F (36.6 C)   Resp 18   Wt 64.9 kg   LMP 08/18/2018   SpO2 99%   BMI 22.40 kg/m    Physical Exam  Constitutional: She is oriented to person, place, and time. She appears well-developed and well-nourished.  Patient appears to be deconditioned.  HENT:  Right Ear: External ear normal.  Left Ear: External ear normal.  Eyes: Conjunctivae are normal.  Neck: Normal range of motion. Neck supple.  Pulmonary/Chest: Effort normal.  Musculoskeletal: Normal range of motion. She exhibits no deformity.  Neurological: She is alert and oriented to person, place, and time. No sensory deficit. Coordination normal.  Skin: Skin is warm and dry.  Nursing note and vitals reviewed.    UC Treatments / Results  Labs (all labs ordered are listed, but only abnormal results are displayed) Labs Reviewed - No data to display  EKG None  Radiology No results found.  Procedures Procedures (including critical care time)  Medications Ordered in UC Medications - No data to display  Initial Impression / Assessment and Plan / UC Course  I have reviewed the triage vital signs and the nursing notes.  Pertinent labs & imaging results that were available during my care of the patient were reviewed by me and considered in my medical decision making (see chart for details).     Final Clinical Impressions(s) / UC Diagnoses   Final diagnoses:  Chronic midline low back pain without sciatica     Discharge Instructions     You need to follow-up with your personal doctor and orthopedist    ED Prescriptions    Medication Sig Dispense Auth. Provider   naproxen (NAPROSYN) 500 MG tablet Take 1 tablet (500 mg total) by mouth 2 (two) times daily. 30 tablet Elvina Sidle, MD   cyclobenzaprine  (FLEXERIL) 5 MG tablet Take 1 tablet (5 mg total) by mouth at bedtime. 7 tablet Elvina Sidle, MD     Controlled Substance Prescriptions [place] Controlled Substance Registry consulted? Not Applicable   Elvina Sidle, MD 08/21/18 [phone]

## 2019-05-23 ENCOUNTER — Other Ambulatory Visit: Payer: Self-pay

## 2019-05-23 ENCOUNTER — Encounter (HOSPITAL_COMMUNITY): Payer: Self-pay | Admitting: Emergency Medicine

## 2019-05-23 ENCOUNTER — Emergency Department (HOSPITAL_COMMUNITY)
Admission: EM | Admit: 2019-05-23 | Discharge: 2019-05-24 | Disposition: A | Discharge status: Home / Self Care | Payer: No Typology Code available for payment source | Attending: Emergency Medicine | Admitting: Emergency Medicine

## 2019-05-23 ENCOUNTER — Emergency Department (HOSPITAL_COMMUNITY): Payer: No Typology Code available for payment source

## 2019-05-23 DIAGNOSIS — J45909 Unspecified asthma, uncomplicated: Secondary | ICD-10-CM | POA: Insufficient documentation

## 2019-05-23 DIAGNOSIS — Z880 Allergy status to penicillin: Secondary | ICD-10-CM | POA: Insufficient documentation

## 2019-05-23 DIAGNOSIS — Y92410 Unspecified street and highway as the place of occurrence of the external cause: Secondary | ICD-10-CM | POA: Insufficient documentation

## 2019-05-23 DIAGNOSIS — Z79899 Other long term (current) drug therapy: Secondary | ICD-10-CM | POA: Diagnosis not present

## 2019-05-23 DIAGNOSIS — F17210 Nicotine dependence, cigarettes, uncomplicated: Secondary | ICD-10-CM | POA: Insufficient documentation

## 2019-05-23 DIAGNOSIS — R0789 Other chest pain: Secondary | ICD-10-CM | POA: Insufficient documentation

## 2019-05-23 DIAGNOSIS — Y999 Unspecified external cause status: Secondary | ICD-10-CM | POA: Diagnosis not present

## 2019-05-23 DIAGNOSIS — Y9389 Activity, other specified: Secondary | ICD-10-CM | POA: Insufficient documentation

## 2019-05-23 DIAGNOSIS — S39012A Strain of muscle, fascia and tendon of lower back, initial encounter: Secondary | ICD-10-CM | POA: Diagnosis not present

## 2019-05-23 DIAGNOSIS — Z888 Allergy status to other drugs, medicaments and biological substances status: Secondary | ICD-10-CM | POA: Diagnosis not present

## 2019-05-23 DIAGNOSIS — S3992XA Unspecified injury of lower back, initial encounter: Secondary | ICD-10-CM | POA: Diagnosis present

## 2019-05-23 DIAGNOSIS — Z885 Allergy status to narcotic agent status: Secondary | ICD-10-CM | POA: Diagnosis not present

## 2019-05-23 NOTE — ED Notes (Signed)
Provider at bedside
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
## 2019-05-24 ENCOUNTER — Emergency Department (HOSPITAL_COMMUNITY): Payer: No Typology Code available for payment source

## 2019-05-24 LAB — POCT I-STAT EG7
Acid-base deficit: 1 mmol/L (ref 0.0–2.0)
Bicarbonate: 25.1 mmol/L (ref 20.0–28.0)
Calcium, Ion: 1.21 mmol/L (ref 1.15–1.40)
HCT: 41 % (ref 36.0–46.0)
Hemoglobin: 13.9 g/dL (ref 12.0–15.0)
O2 Saturation: 32 %
Potassium: 3.6 mmol/L (ref 3.5–5.1)
Sodium: 141 mmol/L (ref 135–145)
TCO2: 26 mmol/L (ref 22–32)
pCO2, Ven: 44.9 mmHg (ref 44.0–60.0)
pH, Ven: 7.355 (ref 7.250–7.430)
pO2, Ven: 21 mmHg — CL (ref 32.0–45.0)

## 2019-05-24 LAB — I-STAT BETA HCG BLOOD, ED (MC, WL, AP ONLY): I-stat hCG, quantitative: <5 m[IU]/mL (ref <5)

## 2019-05-24 MED ORDER — TRAMADOL HCL 50 MG PO TABS
50.0000 mg | ORAL_TABLET | Freq: Once | ORAL | Status: AC
  Administered 2019-05-24: 50 mg via ORAL
  Filled 2019-05-24: qty 1

## 2019-05-24 MED ORDER — NAPROXEN 250 MG PO TABS
500.0000 mg | ORAL_TABLET | Freq: Once | ORAL | Status: AC
  Administered 2019-05-24: 500 mg via ORAL
  Filled 2019-05-24: qty 2

## 2019-05-24 MED ORDER — TRAMADOL HCL 50 MG PO TABS: 50.0000 mg | ORAL_TABLET | Freq: Three times a day (TID) | ORAL | 0 refills | Status: DC | PRN

## 2019-05-24 MED ORDER — NAPROXEN 500 MG PO TABS: 500.0000 mg | ORAL_TABLET | Freq: Two times a day (BID) | ORAL | 0 refills | Status: DC

## 2019-05-24 MED ORDER — METHOCARBAMOL 500 MG PO TABS
500.0000 mg | ORAL_TABLET | Freq: Once | ORAL | Status: AC
  Administered 2019-05-24: 500 mg via ORAL
  Filled 2019-05-24: qty 1

## 2019-05-24 MED ORDER — METHOCARBAMOL 500 MG PO TABS: 500.0000 mg | ORAL_TABLET | Freq: Three times a day (TID) | ORAL | 0 refills | Status: DC | PRN
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
## 2019-05-24 NOTE — ED Notes (Signed)
Patient transported to X-ray 

## 2019-08-03 ENCOUNTER — Ambulatory Visit (HOSPITAL_COMMUNITY)
Admission: EM | Admit: 2019-08-03 | Discharge: 2019-08-03 | Discharge status: Against Medical Advice | Payer: Medicaid Other

## 2019-08-03 NOTE — ED Notes (Signed)
Pt stated she wanted a "free" covid test and did not want to pay or be billed for a visit so she left.
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
## 2020-11-18 ENCOUNTER — Encounter (HOSPITAL_COMMUNITY): Payer: Self-pay

## 2020-11-18 ENCOUNTER — Other Ambulatory Visit: Payer: Self-pay

## 2020-11-18 ENCOUNTER — Ambulatory Visit (HOSPITAL_COMMUNITY)
Admission: EM | Admit: 2020-11-18 | Discharge: 2020-11-18 | Disposition: A | Discharge status: Home / Self Care | Payer: Medicaid Other | Attending: Internal Medicine | Admitting: Internal Medicine

## 2020-11-18 DIAGNOSIS — Z20822 Contact with and (suspected) exposure to covid-19: Secondary | ICD-10-CM | POA: Diagnosis present

## 2020-11-18 DIAGNOSIS — U071 COVID-19: Secondary | ICD-10-CM | POA: Insufficient documentation

## 2020-11-18 DIAGNOSIS — R6889 Other general symptoms and signs: Secondary | ICD-10-CM | POA: Insufficient documentation

## 2020-11-18 LAB — SARS CORONAVIRUS 2 (TAT 6-24 HRS): SARS Coronavirus 2: POSITIVE — AB

## 2020-11-18 MED ORDER — IBUPROFEN 600 MG PO TABS: 600.0000 mg | ORAL_TABLET | Freq: Four times a day (QID) | ORAL | 0 refills | Status: DC | PRN

## 2020-11-18 MED ORDER — ONDANSETRON 4 MG PO TBDP: 4.0000 mg | ORAL_TABLET | Freq: Three times a day (TID) | ORAL | 0 refills | Status: DC | PRN
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
## 2020-11-18 NOTE — Discharge Instructions (Signed)
Please increase oral fluid intake Take medications as prescribed Please quarantine until COVID-19 test results are available If you have worsening symptoms please return to urgent care to be reevaluated. We will call you with recommendations if your labs are abnormal.
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
## 2020-11-19 ENCOUNTER — Telehealth (HOSPITAL_COMMUNITY): Payer: Self-pay | Admitting: *Deleted

## 2020-11-19 MED ORDER — ONDANSETRON 4 MG PO TBDP: 4.0000 mg | ORAL_TABLET | Freq: Three times a day (TID) | ORAL | 0 refills | Status: DC | PRN

## 2020-11-19 MED ORDER — IBUPROFEN 600 MG PO TABS: 600.0000 mg | ORAL_TABLET | Freq: Four times a day (QID) | ORAL | 0 refills | Status: DC | PRN

## 2020-12-18 ENCOUNTER — Ambulatory Visit: Payer: Self-pay

## 2020-12-18 ENCOUNTER — Other Ambulatory Visit: Payer: Self-pay

## 2022-01-14 ENCOUNTER — Emergency Department
Admission: EM | Admit: 2022-01-14 | Discharge: 2022-01-14 | Disposition: A | Discharge status: Home / Self Care | Payer: Self-pay | Source: Home / Self Care

## 2022-01-14 ENCOUNTER — Emergency Department (INDEPENDENT_AMBULATORY_CARE_PROVIDER_SITE_OTHER): Payer: Self-pay

## 2022-01-14 ENCOUNTER — Encounter: Payer: Self-pay | Admitting: Family Medicine

## 2022-01-14 ENCOUNTER — Other Ambulatory Visit: Payer: Self-pay

## 2022-01-14 DIAGNOSIS — R059 Cough, unspecified: Secondary | ICD-10-CM

## 2022-01-14 DIAGNOSIS — J309 Allergic rhinitis, unspecified: Secondary | ICD-10-CM

## 2022-01-14 MED ORDER — PREDNISONE 20 MG PO TABS: ORAL_TABLET | ORAL | 0 refills | Status: DC

## 2022-01-14 MED ORDER — BENZONATATE 200 MG PO CAPS: 200.0000 mg | ORAL_CAPSULE | Freq: Three times a day (TID) | ORAL | 0 refills | Status: AC | PRN

## 2022-01-14 MED ORDER — DOXYCYCLINE HYCLATE 100 MG PO CAPS: 100.0000 mg | ORAL_CAPSULE | Freq: Two times a day (BID) | ORAL | 0 refills | Status: DC

## 2022-01-14 MED ORDER — FEXOFENADINE HCL 180 MG PO TABS: 180.0000 mg | ORAL_TABLET | Freq: Every day | ORAL | 0 refills | Status: DC

## 2022-01-14 NOTE — ED Provider Notes (Signed)
?[facility] ? ? ? ?CSN: [id number] ?Arrival date & time: 01/14/22  1623 ? ? ?  ? ?History   ?Chief Complaint ?Chief Complaint  ?Patient presents with  ? Cough  ? ? ?HPI ?Natalie Colon is a 41 y.o. female.  ? ?HPI 41 year old female presents with cough for several weeks.  Patient reports being diagnosed with Influenza in the beginning of March and feels that she has not fully recovered.  Currently unable to see inpatient medical records where she was evaluated and treated for influenza at the beginning of this month.  Additionally, patient reports that she was treated by her PCP with antibiotics steroids and albuterol sometime earlier this month. Reports taking albuterol and prednisone for cough with no improvement.  Patient reports that she is coughing so much that she is urinated on herself.  Patient currently denies any urinary symptoms.  PMH significant for asthma and mitral valve prolapse.  Reports that she is a current cigarette smoker (0.2 packs/day with 10-year pack history). ? ?Past Medical History:  ?Diagnosis Date  ? ADHD (attention deficit hyperactivity disorder)   ? Asthma   ? Depression   ? Herpes   ? Mitral valve prolapse 2010  ? Echo and workup in [place]  ? ? ?Patient Active Problem List  ? Diagnosis Date Noted  ? Chest pain 05/20/2012  ? ? ?Past Surgical History:  ?Procedure Laterality Date  ? CYSTECTOMY    ? Neck  ? Pilonidal cyst removal    ? Tubal implant procedure    ? ? ?OB History   ?No obstetric history on file. ?  ? ? ? ?Home Medications   ? ?Prior to Admission medications   ?Medication Sig Start Date End Date Taking? Authorizing Provider  ?benzonatate (TESSALON) 200 MG capsule Take 1 capsule (200 mg total) by mouth 3 (three) times daily as needed for up to 7 days for cough. 01/14/22 01/21/22 Yes Trevor Iha, FNP  ?doxycycline (VIBRAMYCIN) 100 MG capsule Take 1 capsule (100 mg total) by mouth 2 (two) times daily for 10 days. 01/14/22 01/24/22 Yes Trevor Iha, FNP   ?fexofenadine (ALLEGRA ALLERGY) 180 MG tablet Take 1 tablet (180 mg total) by mouth daily for 15 days. 01/14/22 01/29/22 Yes Trevor Iha, FNP  ?predniSONE (DELTASONE) 20 MG tablet Take 3 tabs PO daily x 5 days. 01/14/22  Yes Trevor Iha, FNP  ?ibuprofen (ADVIL) 600 MG tablet Take 1 tablet (600 mg total) by mouth every 6 (six) hours as needed. 11/19/20   Lamptey, Britta Mccreedy, MD  ?methocarbamol (ROBAXIN) 500 MG tablet Take 1 tablet (500 mg total) by mouth every 8 (eight) hours as needed for muscle spasms. 05/24/19   Antony Madura, PA-C  ?metoprolol tartrate (LOPRESSOR) 50 MG tablet Take one tablet by mouth one hour prior to CT Scan 01/07/18   Kathleene Hazel, MD  ?mometasone (NASONEX) 50 MCG/ACT nasal spray Place 2 sprays into the nose daily.    [provider]  ?naproxen (NAPROSYN) 500 MG tablet Take 1 tablet (500 mg total) by mouth 2 (two) times daily. 05/24/19   Antony Madura, PA-C  ?ondansetron (ZOFRAN ODT) 4 MG disintegrating tablet Take 1 tablet (4 mg total) by mouth every 8 (eight) hours as needed for nausea or vomiting. 11/19/20   Lamptey, Britta Mccreedy, MD  ?traMADol (ULTRAM) 50 MG tablet Take 1 tablet (50 mg total) by mouth every 8 (eight) hours as needed for moderate pain or severe pain. 05/24/19   Antony Madura, PA-C  ?valACYclovir (  VALTREX) 500 MG tablet Take 500 mg by mouth 2 (two) times daily.    [provider]  ? ? ?Family History ?Family History  ?Problem Relation Age of Onset  ? Heart attack Maternal Grandmother   ? COPD Maternal Grandmother   ? COPD Mother   ? Hypertension Mother   ? Heart disease Maternal Grandfather   ? COPD Maternal Grandfather   ? ? ?Social History ?Social History  ? ?Tobacco Use  ? Smoking status: Some Days  ?  Packs/day: 0.20  ?  Years: 10.00  ?  Pack years: 2.00  ?  Types: Cigarettes  ? Smokeless tobacco: Never  ?Substance Use Topics  ? Alcohol use: Yes  ?  Alcohol/week: 1.0 standard drink  ?  Types: 1 Standard drinks or equivalent per week  ?  Comment: occasion   ? Drug use: No  ? ? ? ?Allergies   ?Hydrocodone-acetaminophen, Latex, Penicillins, and Vicodin [hydrocodone-acetaminophen] ? ? ?Review of Systems ?Review of Systems  ?Respiratory:  Positive for cough.   ?All other systems reviewed and are negative. ? ? ?Physical Exam ?Triage Vital Signs ?ED Triage Vitals  ?Enc Vitals Group  ?   BP   ?   Pulse   ?   Resp   ?   Temp   ?   Temp src   ?   SpO2   ?   Weight   ?   Height   ?   Head Circumference   ?   Peak Flow   ?   Pain Score   ?   Pain Loc   ?   Pain Edu?   ?   Excl. in GC?   ? ?No data found. ? ?Updated Vital Signs ?BP 112/79 (BP Location: Right Arm)   Pulse 90   Temp 98.4 ?F (36.9 ?C) (Oral)   Resp 18   LMP 12/28/2021   SpO2 99%  ? ? ? ?Physical Exam ?Vitals and nursing note reviewed.  ?Constitutional:   ?   General: She is not in acute distress. ?   Appearance: Normal appearance. She is obese. She is not ill-appearing.  ?HENT:  ?   Head: Normocephalic and atraumatic.  ?   Right Ear: Tympanic membrane, ear canal and external ear normal.  ?   Left Ear: Tympanic membrane, ear canal and external ear normal.  ?   Mouth/Throat:  ?   Mouth: Mucous membranes are moist.  ?   Pharynx: Oropharynx is clear.  ?   Comments: Moderate to significant amount of clear drainage of posterior oropharynx noted ?Eyes:  ?   Extraocular Movements: Extraocular movements intact.  ?   Conjunctiva/sclera: Conjunctivae normal.  ?   Pupils: Pupils are equal, round, and reactive to light.  ?Cardiovascular:  ?   Rate and Rhythm: Normal rate and regular rhythm.  ?   Pulses: Normal pulses.  ?   Heart sounds: Normal heart sounds. No murmur heard. ?Pulmonary:  ?   Effort: Pulmonary effort is normal.  ?   Breath sounds: Normal breath sounds. No wheezing, rhonchi or rales.  ?   Comments: Frequent nonproductive cough noted on exam ?Musculoskeletal:  ?   Cervical back: Normal range of motion and neck supple.  ?Skin: ?   General: Skin is warm and dry.  ?Neurological:  ?   General: No focal deficit  present.  ?   Mental Status: She is alert and oriented to person, place, and time.  ? ? ? ?UC  Treatments / Results  ?Labs ?(all labs ordered are listed, but only abnormal results are displayed) ?Labs Reviewed - No data to display ? ?EKG ? ? ?Radiology ?DG Chest 2 View ? ?Result Date: 01/14/2022 ?CLINICAL DATA:  41 year-old female states she was dx with flu beginning of march and has not fully recovered. EXAM: CHEST - 2 VIEW COMPARISON:  Chest radiograph 05/24/2019 FINDINGS: The cardiomediastinal contours are within normal limits. The lungs are clear. No pneumothorax or pleural effusion. No acute finding in the visualized skeleton. IMPRESSION: No active cardiopulmonary disease. Electronically Signed   By: Emmaline KluverNancy  Ballantyne M.D.   On: 01/14/2022 17:03   ? ?Procedures ?Procedures (including critical care time) ? ?Medications Ordered in UC ?Medications - No data to display ? ?Initial Impression / Assessment and Plan / UC Course  ?I have reviewed the triage vital signs and the nursing notes. ? ?Pertinent labs & imaging results that were available during my care of the patient were reviewed by me and considered in my medical decision making (see chart for details). ? ?  ? ?MDM: 1.Cough- Rx'd Doxycycline, Prednisone, and Tessalon Perles; 2.  Allergic rhinitis-Rx'd Allegra. Instructed patient to discontinue Zyrtec, Nasonex, and any other OTC remedy.  Advised/informed patient of the chest x-ray results today no active cardiopulmonary disease with hard copy provided to patient.  Instructed patient to take medication as directed with food to completion.  Advised patient to take his own and Allegra with first dose of Doxycycline for the next 5 of 10 days.  Advised may use Allegra as needed afterwards for concurrent postnasal drainage/drip.  Advised patient may use Tessalon Perles daily or as needed for cough.  Advised patient if symptoms are worsening and/or unresolved please follow-up with PCP or here for further evaluation.   Work excuse note provided to patient prior to discharge this evening.  Patient discharged home, hemodynamically stable. ?Final Clinical Impressions(s) / UC Diagnoses  ? ?Final diagnoses:  ?Cough, unspecified type  ?Aller

## 2022-01-14 NOTE — Discharge Instructions (Addendum)
Instructed patient to discontinue Zyrtec, Nasonex, and any other OTC remedy.  Advised/informed patient of the chest x-ray results today no active cardiopulmonary disease with hard copy provided to patient.  Instructed patient to take medication as directed with food to completion.  Advised patient to take his own and Allegra with first dose of Doxycycline for the next 5 of 10 days.  Advised may use Allegra as needed afterwards for concurrent postnasal drainage/drip.  Advised patient may use Tessalon Perles daily or as needed for cough.  Advised patient if symptoms are worsening and/or unresolved please follow-up with PCP or here for further evaluation. ?

## 2022-01-14 NOTE — ED Triage Notes (Signed)
Pt states she was dx with flu beginning of march and has not fully recovered. She has taken albuterol and predinsone for cough with no improvement. She has been coughing so much that she is urinating on herself. Denies urinary symptoms ?

## 2022-01-24 ENCOUNTER — Other Ambulatory Visit: Payer: Self-pay

## 2022-01-24 ENCOUNTER — Encounter (HOSPITAL_COMMUNITY): Payer: Self-pay | Admitting: *Deleted

## 2022-01-24 ENCOUNTER — Ambulatory Visit (HOSPITAL_COMMUNITY)
Admission: EM | Admit: 2022-01-24 | Discharge: 2022-01-24 | Disposition: A | Discharge status: Home / Self Care | Payer: Medicaid Other | Attending: Family Medicine | Admitting: Family Medicine

## 2022-01-24 DIAGNOSIS — J40 Bronchitis, not specified as acute or chronic: Secondary | ICD-10-CM

## 2022-01-24 DIAGNOSIS — J0100 Acute maxillary sinusitis, unspecified: Secondary | ICD-10-CM

## 2022-01-24 MED ORDER — LEVOFLOXACIN 500 MG PO TABS: 500.0000 mg | ORAL_TABLET | Freq: Every day | ORAL | 0 refills | Status: AC

## 2022-01-24 MED ORDER — PROMETHAZINE-DM 6.25-15 MG/5ML PO SYRP: 5.0000 mL | ORAL_SOLUTION | Freq: Four times a day (QID) | ORAL | 0 refills | Status: DC | PRN

## 2022-01-24 NOTE — ED Triage Notes (Signed)
Pt reports a cough for one month, bronchitis and Pt reports she has stress in cont. When coughing. Pt has a sore throat. Pt still does not feel better. ?

## 2022-01-24 NOTE — Discharge Instructions (Signed)
You were seen today for continued cough.  ?Overall your exam is normal today, but you do have sinus tenderness.  I will treat for possible sinus infection today with an antibiotic to take daily x 7 days.  ?I have also sent out a cough syrup trial as well.  ?I recommend you continue the albuterol inhaler you have at home.  ?You may trial over the counter prilosec to see if that helps for possible reflux symptoms  ?If you continue to have symptoms you should follow up with your primary care provider as there is little else we can do the urgent care.  You may need to see a specialist for further testing.  ?

## 2022-01-24 NOTE — ED Provider Notes (Signed)
?[facility] ? ? ? ?CSN: [id number] ?Arrival date & time: 01/24/22  0915 ? ? ?  ? ?History   ?Chief Complaint ?Chief Complaint  ?Patient presents with  ? Cough  ? ? ?HPI ?NYANNA HEIDEMAN is a 41 y.o. female.  ? ?Patient is here for a cough.  Has had this x 2 months.  Started with the flu, and then bronchitis.  Then with sinus congestion, drainage due to possible allergies.  She was given prednisone, zyrtec, tessalon, albuterol hfa and abx  zpack.  She was feeling better for several weeks.   ?She went to the urgent care several weeks ago, due to sob with the cough, and incontinence. Given allegra, prednisone and doxy, tessalon perles.  She is almost done with those medications but not working.  ?Still with cough, chest pain due to coughing, sob.  Also with sinus congestion and drainage.  ?Covid test negative.  ? ?Past Medical History:  ?Diagnosis Date  ? ADHD (attention deficit hyperactivity disorder)   ? Asthma   ? Depression   ? Herpes   ? Mitral valve prolapse 2010  ? Echo and workup in [place]  ? ? ?Patient Active Problem List  ? Diagnosis Date Noted  ? Chest pain 05/20/2012  ? ? ?Past Surgical History:  ?Procedure Laterality Date  ? CYSTECTOMY    ? Neck  ? Pilonidal cyst removal    ? Tubal implant procedure    ? ? ?OB History   ?No obstetric history on file. ?  ? ? ? ?Home Medications   ? ?Prior to Admission medications   ?Medication Sig Start Date End Date Taking? Authorizing Provider  ?doxycycline (VIBRAMYCIN) 100 MG capsule Take 1 capsule (100 mg total) by mouth 2 (two) times daily for 10 days. 01/14/22 01/24/22  Trevor Iha, FNP  ?fexofenadine (ALLEGRA ALLERGY) 180 MG tablet Take 1 tablet (180 mg total) by mouth daily for 15 days. 01/14/22 01/29/22  Trevor Iha, FNP  ?ibuprofen (ADVIL) 600 MG tablet Take 1 tablet (600 mg total) by mouth every 6 (six) hours as needed. 11/19/20   Lamptey, Britta Mccreedy, MD  ?methocarbamol (ROBAXIN) 500 MG tablet Take 1 tablet (500 mg total) by mouth every 8 (eight) hours  as needed for muscle spasms. 05/24/19   Antony Madura, PA-C  ?metoprolol tartrate (LOPRESSOR) 50 MG tablet Take one tablet by mouth one hour prior to CT Scan 01/07/18   Kathleene Hazel, MD  ?mometasone (NASONEX) 50 MCG/ACT nasal spray Place 2 sprays into the nose daily.    [provider]  ?naproxen (NAPROSYN) 500 MG tablet Take 1 tablet (500 mg total) by mouth 2 (two) times daily. 05/24/19   Antony Madura, PA-C  ?ondansetron (ZOFRAN ODT) 4 MG disintegrating tablet Take 1 tablet (4 mg total) by mouth every 8 (eight) hours as needed for nausea or vomiting. 11/19/20   Lamptey, Britta Mccreedy, MD  ?predniSONE (DELTASONE) 20 MG tablet Take 3 tabs PO daily x 5 days. 01/14/22   Trevor Iha, FNP  ?traMADol (ULTRAM) 50 MG tablet Take 1 tablet (50 mg total) by mouth every 8 (eight) hours as needed for moderate pain or severe pain. 05/24/19   Antony Madura, PA-C  ?valACYclovir (VALTREX) 500 MG tablet Take 500 mg by mouth 2 (two) times daily.    [provider]  ? ? ?Family History ?Family History  ?Problem Relation Age of Onset  ? COPD Mother   ? Hypertension Mother   ? Heart attack Maternal Grandmother   ?  COPD Maternal Grandmother   ? Heart disease Maternal Grandfather   ? COPD Maternal Grandfather   ? ? ?Social History ?Social History  ? ?Tobacco Use  ? Smoking status: Some Days  ?  Packs/day: 0.20  ?  Years: 10.00  ?  Pack years: 2.00  ?  Types: Cigarettes  ? Smokeless tobacco: Never  ?Substance Use Topics  ? Alcohol use: Yes  ?  Alcohol/week: 1.0 standard drink  ?  Types: 1 Standard drinks or equivalent per week  ?  Comment: occasion  ? Drug use: No  ? ? ? ?Allergies   ?Hydrocodone-acetaminophen, Latex, Penicillins, and Vicodin [hydrocodone-acetaminophen] ? ? ?Review of Systems ?Review of Systems  ?Constitutional:  Positive for chills. Negative for fever.  ?HENT:  Positive for congestion and rhinorrhea. Negative for ear pain and sore throat.   ?Respiratory:  Positive for cough and shortness of breath.    ?Cardiovascular:  Positive for chest pain.  ?Gastrointestinal: Negative.   ?Genitourinary: Negative.   ?Musculoskeletal: Negative.   ? ? ?Physical Exam ?Triage Vital Signs ?ED Triage Vitals  ?Enc Vitals Group  ?   BP 01/24/22 1004 121/85  ?   Pulse Rate 01/24/22 1004 89  ?   Resp 01/24/22 1004 18  ?   Temp 01/24/22 1004 98.6 ?F (37 ?C)  ?   Temp src --   ?   SpO2 01/24/22 1004 98 %  ?   Weight --   ?   Height --   ?   Head Circumference --   ?   Peak Flow --   ?   Pain Score 01/24/22 1000 7  ?   Pain Loc --   ?   Pain Edu? --   ?   Excl. in GC? --   ? ?No data found. ? ?Updated Vital Signs ?BP 121/85   Pulse 89   Temp 98.6 ?F (37 ?C)   Resp 18   LMP 12/28/2021   SpO2 98%  ? ?Visual Acuity ?Right Eye Distance:   ?Left Eye Distance:   ?Bilateral Distance:   ? ?Right Eye Near:   ?Left Eye Near:    ?Bilateral Near:    ? ?Physical Exam ?Constitutional:   ?   Appearance: Normal appearance.  ?HENT:  ?   Nose: Congestion and rhinorrhea present.  ?   Left Sinus: Maxillary sinus tenderness present.  ?   Mouth/Throat:  ?   Mouth: Mucous membranes are moist.  ?   Pharynx: Posterior oropharyngeal erythema present. No oropharyngeal exudate.  ?Pulmonary:  ?   Effort: Pulmonary effort is normal.  ?   Breath sounds: Normal breath sounds. No wheezing.  ?   Comments: Has several coughing fits during the visit ?Chest:  ?   Chest wall: Tenderness present.  ?Musculoskeletal:  ?   Cervical back: Normal range of motion and neck supple. No tenderness.  ?Neurological:  ?   Mental Status: She is alert.  ? ? ? ?UC Treatments / Results  ?Labs ?(all labs ordered are listed, but only abnormal results are displayed) ?Labs Reviewed - No data to display ? ?EKG ? ? ?Radiology ?No results found. ? ?Procedures ?Procedures (including critical care time) ? ?Medications Ordered in UC ?Medications - No data to display ? ?Initial Impression / Assessment and Plan / UC Course  ?I have reviewed the triage vital signs and the nursing notes. ? ?Pertinent  labs & imaging results that were available during my care of the patient were reviewed by  me and considered in my medical decision making (see chart for details). ? ?Patient is here for several months of cough, also with sinus congestion and drainage.  She has been on zpack, albuterol, tessalon, as well as doxy and prednisone along with allergy medication.  ?Her lung exam is normal, had recent normal chest xray, but does have left sided sinus tenderness.   ?Long discussion today.  I will treat with an abx today for possible sinus infection as a cause.  She may also has a bit of reflux, so I recommend otc prilosec.  ?If her symptoms continue despite this medication then she needs to f/u with her pcp and possibly see a specialist.  Another abx will likely not work as this is unlikely to be infectious.  She is aware and agrees.  ?  ?Final Clinical Impressions(s) / UC Diagnoses  ? ?Final diagnoses:  ?Bronchitis  ?Acute non-recurrent maxillary sinusitis  ? ? ? ?Discharge Instructions   ? ?  ?You were seen today for continued cough.  ?Overall your exam is normal today, but you do have sinus tenderness.  I will treat for possible sinus infection today with an antibiotic to take daily x 7 days.  ?I have also sent out a cough syrup trial as well.  ?I recommend you continue the albuterol inhaler you have at home.  ?You may trial over the counter prilosec to see if that helps for possible reflux symptoms  ?If you continue to have symptoms you should follow up with your primary care provider as there is little else we can do the urgent care.  You may need to see a specialist for further testing.  ? ? ? ?ED Prescriptions   ? ? Medication Sig Dispense Auth. Provider  ? levofloxacin (LEVAQUIN) 500 MG tablet Take 1 tablet (500 mg total) by mouth daily for 7 days. 7 tablet Derald Lorge, MD  ? promethazine-dextromethorphan (PROMETHAZINE-DM) 6.25-15 MG/5ML syrup Take 5 mLs by mouth 4 (four) times daily as needed for cough. 118 mL  Jannifer Franklin, MD  ? ?  ? ?PDMP not reviewed this encounter. ?  ?Jannifer Franklin, MD ?01/24/22 1037 ? ?

## 2022-04-28 ENCOUNTER — Ambulatory Visit (HOSPITAL_COMMUNITY)
Admission: EM | Admit: 2022-04-28 | Discharge: 2022-04-28 | Disposition: A | Discharge status: Home / Self Care | Payer: Self-pay | Attending: Student | Admitting: Student

## 2022-04-28 ENCOUNTER — Encounter (HOSPITAL_COMMUNITY): Payer: Self-pay

## 2022-04-28 DIAGNOSIS — N6452 Nipple discharge: Secondary | ICD-10-CM | POA: Insufficient documentation

## 2022-04-28 DIAGNOSIS — Z113 Encounter for screening for infections with a predominantly sexual mode of transmission: Secondary | ICD-10-CM | POA: Insufficient documentation

## 2022-04-28 DIAGNOSIS — Z803 Family history of malignant neoplasm of breast: Secondary | ICD-10-CM | POA: Insufficient documentation

## 2022-04-28 DIAGNOSIS — Z3202 Encounter for pregnancy test, result negative: Secondary | ICD-10-CM | POA: Insufficient documentation

## 2022-04-28 DIAGNOSIS — N760 Acute vaginitis: Secondary | ICD-10-CM | POA: Insufficient documentation

## 2022-04-28 DIAGNOSIS — L853 Xerosis cutis: Secondary | ICD-10-CM | POA: Insufficient documentation

## 2022-04-28 LAB — POCT URINALYSIS DIPSTICK, ED / UC
Bilirubin Urine: NEGATIVE
Glucose, UA: NEGATIVE mg/dL
Hgb urine dipstick: NEGATIVE
Ketones, ur: NEGATIVE mg/dL
Leukocytes,Ua: NEGATIVE
Nitrite: NEGATIVE
Protein, ur: NEGATIVE mg/dL
Specific Gravity, Urine: >=1.03 (ref 1.005–1.030)
Urobilinogen, UA: 0.2 mg/dL (ref 0.0–1.0)
pH: 5.5 (ref 5.0–8.0)

## 2022-04-28 LAB — POC URINE PREG, ED: Preg Test, Ur: NEGATIVE

## 2022-04-28 MED ORDER — FLUCONAZOLE 150 MG PO TABS: 150.0000 mg | ORAL_TABLET | Freq: Every day | ORAL | 0 refills | Status: DC

## 2022-04-28 MED ORDER — HYDROCORTISONE 2.5 % EX LOTN: TOPICAL_LOTION | Freq: Two times a day (BID) | CUTANEOUS | 0 refills | Status: AC

## 2022-04-28 NOTE — Discharge Instructions (Addendum)
-  Your pregnancy test was negative -Your urine test was negative. You do not have a UTI.  -For your yeast infection, start the Diflucan (fluconazole)- Take one pill today (day 1). If you're still having symptoms in 3 days, take the second pill.  -We have sent testing for sexually transmitted infections, including BV, gonorrhea, chlamydia, trichomonas, yeast. We will notify you of any positive results once they are received, typically in 2-3 days. If required, we will prescribe any medications you might need. Please refrain from all sexual activity until treatment is complete.  -Seek additional medical attention if you develop fevers/chills, new/worsening abdominal pain, new/worsening vaginal discomfort/discharge, etc.  -Hydrocortisone 2.5% cream for the dry skin, 1-2x daily for 5-7 days. This is stronger than over-the-counter cream. You can also use a thick fragrance - free moisturizer  -I sent a referral to an OB-GYN who can fully evaluate the breast tenderness and nipple discharge. You will receive a call from them. You can alternatively follow-up with [facility], who you have been seeing already for this.

## 2022-04-28 NOTE — ED Triage Notes (Signed)
Pt presents for vaginal irritation x 4 days. She was giving clotrimazole to use but this has not help.

## 2022-04-28 NOTE — ED Provider Notes (Signed)
[facility]    CSN: [id number] Arrival date & time: 04/28/22  1120      History   Chief Complaint No chief complaint on file.   HPI Natalie Colon is a 41 y.o. female presenting with vaginal irritation for 4 days; episode of nipple discharge 1 month ago that has resolved; dry skin dermatitis; STI screen.  She is currently uninsured, but has following with a provider named Clerance Lav, she is unsure of the name of the clinic.  Apparently she was diagnosed with vaginitis last week and prescribed clotrimazole cream, which provides temporary relief, but the symptoms have only gotten worse overall.  She states that today she is having some external vaginal irritation and itching, worse on the right side.  Has not noticed any vaginal discharge, odor, dysuria, frequency, urgency, abdominal pain, flank pain.  Her partner, who was present the entire visit, notes that he recently changed their laundry detergent to a fragranced product.  She states that 1 month ago, she had several days of thin and milky nipple drainage, and her nipples felt heavy.  She is not pregnant, and has not been pregnant or breast-feeding in the last year.  She denies headaches, vision changes, dizziness.  She is not having breast symptoms today . The patient also incidentally mentions itching on the anterior thighs for a few weeks, has tried various over-the-counter creams without relief.  She is requesting full STI panel.  HPI  Past Medical History:  Diagnosis Date   ADHD (attention deficit hyperactivity disorder)    Asthma    Depression    Herpes    Mitral valve prolapse 2010   Echo and workup in [place]    Patient Active Problem List   Diagnosis Date Noted   Chest pain 05/20/2012    Past Surgical History:  Procedure Laterality Date   CYSTECTOMY     Neck   Pilonidal cyst removal     Tubal implant procedure      OB History   No obstetric history on file.      Home Medications    Prior to  Admission medications   Medication Sig Start Date End Date Taking? Authorizing Provider  fluconazole (DIFLUCAN) 150 MG tablet Take 1 tablet (150 mg total) by mouth daily. -For your yeast infection, start the Diflucan (fluconazole)- Take one pill today (day 1). If you're still having symptoms in 3 days, take the second pill. 04/28/22  Yes Rhys Martini, PA-C  hydrocortisone 2.5 % lotion Apply topically 2 (two) times daily for 7 days. 04/28/22 05/05/22 Yes Rhys Martini, PA-C  fexofenadine [facility] ALLERGY) 180 MG tablet Take 1 tablet (180 mg total) by mouth daily for 15 days. 01/14/22 01/29/22  Trevor Iha, FNP  ibuprofen (ADVIL) 600 MG tablet Take 1 tablet (600 mg total) by mouth every 6 (six) hours as needed. 11/19/20   Merrilee Jansky, MD  metoprolol tartrate (LOPRESSOR) 50 MG tablet Take one tablet by mouth one hour prior to CT Scan 01/07/18   Kathleene Hazel, MD  mometasone (NASONEX) 50 MCG/ACT nasal spray Place 2 sprays into the nose daily.    [provider]  naproxen (NAPROSYN) 500 MG tablet Take 1 tablet (500 mg total) by mouth 2 (two) times daily. 05/24/19   Antony Madura, PA-C  ondansetron (ZOFRAN ODT) 4 MG disintegrating tablet Take 1 tablet (4 mg total) by mouth every 8 (eight) hours as needed for nausea or vomiting. 11/19/20   Lamptey, Britta Mccreedy, MD  promethazine-dextromethorphan (PROMETHAZINE-DM) 6.25-15 MG/5ML syrup Take 5 mLs by mouth 4 (four) times daily as needed for cough. 01/24/22   Piontek, Denny Peon, MD  valACYclovir (VALTREX) 500 MG tablet Take 500 mg by mouth 2 (two) times daily.    [provider]    Family History Family History  Problem Relation Age of Onset   COPD Mother    Hypertension Mother    Heart attack Maternal Grandmother    COPD Maternal Grandmother    Heart disease Maternal Grandfather    COPD Maternal Grandfather     Social History Social History   Tobacco Use   Smoking status: Some Days    Packs/day: 0.20    Years: 10.00    Total  pack years: 2.00    Types: Cigarettes   Smokeless tobacco: Never  Substance Use Topics   Alcohol use: Yes    Alcohol/week: 1.0 standard drink of alcohol    Types: 1 Standard drinks or equivalent per week    Comment: occasion   Drug use: No     Allergies   Hydrocodone-acetaminophen, Latex, Penicillins, and Vicodin [hydrocodone-acetaminophen]   Review of Systems Review of Systems  Constitutional:  Negative for chills and fever.  HENT:  Negative for sore throat.   Eyes:  Negative for pain and redness.  Respiratory:  Negative for shortness of breath.   Cardiovascular:  Negative for chest pain.  Gastrointestinal:  Negative for abdominal pain, diarrhea, nausea and vomiting.  Genitourinary:  Positive for vaginal discharge. Negative for decreased urine volume, difficulty urinating, dysuria, flank pain, frequency, genital sores, hematuria and urgency.  Musculoskeletal:  Negative for back pain.  Skin:  Negative for rash.  All other systems reviewed and are negative.    Physical Exam Triage Vital Signs ED Triage Vitals [04/28/22 1141]  Enc Vitals Group     BP (!) 125/91     Pulse Rate 89     Resp 18     Temp 99 F (37.2 C)     Temp Source Oral     SpO2 100 %     Weight      Height      Head Circumference      Peak Flow      Pain Score      Pain Loc      Pain Edu?      Excl. in GC?    No data found.  Updated Vital Signs BP (!) 125/91 (BP Location: Left Arm)   Pulse 89   Temp 99 F (37.2 C) (Oral)   Resp 18   LMP 04/26/2022   SpO2 100%   Visual Acuity Right Eye Distance:   Left Eye Distance:   Bilateral Distance:    Right Eye Near:   Left Eye Near:    Bilateral Near:     Physical Exam Vitals reviewed. Exam conducted with a chaperone present.  Constitutional:      General: She is not in acute distress.    Appearance: Normal appearance. She is not ill-appearing.  HENT:     Head: Normocephalic and atraumatic.     Mouth/Throat:     Mouth: Mucous membranes  are moist.     Comments: Moist mucous membranes Eyes:     Extraocular Movements: Extraocular movements intact.     Pupils: Pupils are equal, round, and reactive to light.  Cardiovascular:     Rate and Rhythm: Normal rate and regular rhythm.     Heart sounds: Normal heart sounds.  Pulmonary:  Effort: Pulmonary effort is normal.     Breath sounds: Normal breath sounds. No wheezing, rhonchi or rales.  Abdominal:     General: Bowel sounds are normal. There is no distension.     Palpations: Abdomen is soft. There is no mass.     Tenderness: There is no abdominal tenderness. There is no right CVA tenderness, left CVA tenderness, guarding or rebound.  Genitourinary:    Comments: Chaperone: carrie, RN. Scant white vaginal discharge visible externally. Internal exam not performed. There were no rashes or lesions on the external genitalia or groin. Skin:    General: Skin is warm.     Capillary Refill: Capillary refill takes less than 2 seconds.     Comments: Good skin turgor Bilateral thighs with few patches of dry scaly skin   Neurological:     General: No focal deficit present.     Mental Status: She is alert and oriented to person, place, and time.  Psychiatric:        Mood and Affect: Mood normal.        Behavior: Behavior normal.      UC Treatments / Results  Labs (all labs ordered are listed, but only abnormal results are displayed) Labs Reviewed  POCT URINALYSIS DIPSTICK, ED / UC  POC URINE PREG, ED  CERVICOVAGINAL ANCILLARY ONLY    EKG   Radiology No results found.  Procedures Procedures (including critical care time)  Medications Ordered in UC Medications - No data to display  Initial Impression / Assessment and Plan / UC Course  I have reviewed the triage vital signs and the nursing notes.  Pertinent labs & imaging results that were available during my care of the patient were reviewed by me and considered in my medical decision making (see chart for  details).     This patient is a very pleasant 41 y.o. year old female presenting with vaginitis; dry skin dermatitis; episode of nipple discharge that seems to have resolved on its own. Afebrile, nontachycardic, no reproducible abd pain or CVAT.  U-preg negative UA wnl, did not send culture. I collected a cervicovaginal swab for G/C, trich, yeast, BV testing. Declines HIV, RPR. Safe sex precautions.   On exam, there is scant white discharge visible externally, she appears to have vaginal candidiasis.  Will manage with Diflucan as below, she is in agreement. Can continue the clotrimazole externally as needed.  For the nipple discharge, there is no nipple discharge or breast tenderness today.  She is having no headaches, vision changes, vision loss, so low concern for acute pituitary adenoma. She is not pregnant or breastfeeding, and has not been pregnant in the last year.  I have referred her to GYN for further management, she is in agreement.  She apparently does have a family history of breast cancer, so they could consider mammogram.  She is alternatively welcome to follow-up with the provider named Clerance Lav that she has been seeing already, I do not have access to these records.  For the dry skin dermatitis, hydrocortisone cream sent to use as needed. Thick emollient moisturizer.  ED return precautions discussed. Patient and partner (who was present entire visit) verbalizes understanding and agreement.    Final Clinical Impressions(s) / UC Diagnoses   Final diagnoses:  Vaginitis and vulvovaginitis  Breast discharge  Family history of breast cancer  Dry skin dermatitis  Negative pregnancy test  Routine screening for STI (sexually transmitted infection)     Discharge Instructions      -  Your pregnancy test was negative -Your urine test was negative. You do not have a UTI.  -For your yeast infection, start the Diflucan (fluconazole)- Take one pill today (day 1). If you're still  having symptoms in 3 days, take the second pill.  -We have sent testing for sexually transmitted infections, including BV, gonorrhea, chlamydia, trichomonas, yeast. We will notify you of any positive results once they are received, typically in 2-3 days. If required, we will prescribe any medications you might need. Please refrain from all sexual activity until treatment is complete.  -Seek additional medical attention if you develop fevers/chills, new/worsening abdominal pain, new/worsening vaginal discomfort/discharge, etc.  -Hydrocortisone 2.5% cream for the dry skin, 1-2x daily for 5-7 days. This is stronger than over-the-counter cream. You can also use a thick fragrance - free moisturizer  -I sent a referral to an OB-GYN who can fully evaluate the breast tenderness and nipple discharge. You will receive a call from them. You can alternatively [facility], who you have been seeing already for this.    ED Prescriptions     Medication Sig Dispense Auth. Provider   fluconazole (DIFLUCAN) 150 MG tablet Take 1 tablet (150 mg total) by mouth daily. -For your yeast infection, start the Diflucan (fluconazole)- Take one pill today (day 1). If you're still having symptoms in 3 days, take the second pill. 2 tablet Rhys Martini, PA-C   hydrocortisone 2.5 % lotion Apply topically 2 (two) times daily for 7 days. 59 mL Rhys Martini, PA-C      PDMP not reviewed this encounter.   Rhys Martini, PA-C 04/28/22 1237

## 2022-04-29 LAB — CERVICOVAGINAL ANCILLARY ONLY
Bacterial Vaginitis (gardnerella): NEGATIVE
Candida Glabrata: NEGATIVE
Candida Vaginitis: NEGATIVE
Chlamydia: NEGATIVE
Comment: NEGATIVE
Comment: NEGATIVE
Comment: NEGATIVE
Comment: NEGATIVE
Comment: NEGATIVE
Comment: NORMAL
Neisseria Gonorrhea: NEGATIVE
Trichomonas: NEGATIVE

## 2022-07-05 ENCOUNTER — Other Ambulatory Visit: Payer: Self-pay

## 2022-07-05 DIAGNOSIS — N6452 Nipple discharge: Secondary | ICD-10-CM

## 2022-08-20 ENCOUNTER — Ambulatory Visit: Admission: RE | Admit: 2022-08-20 | Payer: Self-pay | Source: Ambulatory Visit

## 2022-08-20 ENCOUNTER — Other Ambulatory Visit (HOSPITAL_COMMUNITY)
Admission: RE | Admit: 2022-08-20 | Discharge: 2022-08-20 | Disposition: A | Discharge status: Home / Self Care | Payer: Medicaid Other | Source: Ambulatory Visit | Attending: Obstetrics and Gynecology | Admitting: Obstetrics and Gynecology

## 2022-08-20 ENCOUNTER — Ambulatory Visit: Payer: Self-pay

## 2022-08-20 ENCOUNTER — Ambulatory Visit: Payer: Self-pay | Admitting: *Deleted

## 2022-08-20 ENCOUNTER — Ambulatory Visit
Admission: RE | Admit: 2022-08-20 | Discharge: 2022-08-20 | Disposition: A | Discharge status: Home / Self Care | Payer: No Typology Code available for payment source | Source: Ambulatory Visit | Attending: Obstetrics and Gynecology | Admitting: Obstetrics and Gynecology

## 2022-08-20 VITALS — Wt 197.1 lb

## 2022-08-20 DIAGNOSIS — N6452 Nipple discharge: Secondary | ICD-10-CM

## 2022-08-20 DIAGNOSIS — Z1239 Encounter for other screening for malignant neoplasm of breast: Secondary | ICD-10-CM

## 2022-08-20 NOTE — Progress Notes (Signed)
[name] is a 41 y.o. female who presents to [facility] clinic today with complaint of bilateral milky breast discharge since August 2023 that is spontaneous at times.    Pap Smear: Pap smear not completed today. Last Pap smear was in April 2022 at the [facility] clinic and was normal per patient. Per patient has no history of an abnormal Pap smear. Last Pap smear result is not available in Epic.   Physical exam: Breasts Breasts symmetrical. No skin abnormalities bilateral breasts. No nipple retraction bilateral breasts. Bilateral milky breast discharge expressed on exam. Sample of discharge from each breast sent to Cytology for evaluation. No lymphadenopathy. No lumps palpated bilateral breasts. No complaints of pain or tenderness on exam.      Pelvic/Bimanual Pap is not indicated today per [facility]CP guidelines.   Smoking History: Patient is a former smoker that quit one year ago.   Patient Navigation: Patient education provided. Access to services provided for patient through [facility]CP program.    Breast and Cervical Cancer Risk Assessment: Patient has family history of her maternal grandmother having breast cancer. Patient has no known genetic mutations or history of radiation treatment to the chest before age 50. Patient does not have history of cervical dysplasia, immunocompromised, or DES exposure in-utero.  Risk Assessment     Risk Scores       08/20/2022   Last edited by: Royston Bake, CMA   5-year risk: 0.4 %   Lifetime risk: 7.5 %            A: [facility]CP exam without pap smear Complaint of bilateral breast discharge.  P: [facility] for a [facility]. Appointment scheduled Tuesday, August 20, 2022 at 1340.  Natalie Parish, RN 08/20/2022 11:32 AM

## 2022-08-20 NOTE — Patient Instructions (Addendum)
Explained breast self awareness with Suzette Battiest. Patient did not need a Pap smear today due to last Pap smear was in April 2022 per patient. Let her know [facility] will cover Pap smears every 3 years unless has a history of abnormal Pap smears. Referred patient to the [facility] for a diagnostic mammogram. Appointment scheduled Tuesday, August 20, 2022 at 1340. Patient aware of appointment and will be there. Let patient know will follow up with her within the next couple weeks with results of breast discharge by phone. [place] verbalized understanding.  Hosie Sharman, Arvil Chaco, RN 11:32 AM

## 2022-08-21 ENCOUNTER — Telehealth: Payer: Self-pay

## 2022-08-21 NOTE — Telephone Encounter (Signed)
Attempted to contact patient regarding mammogram result recommendations. Left message on identifying voicemail requesting a return call.

## 2022-08-22 ENCOUNTER — Telehealth: Payer: Self-pay

## 2022-08-22 LAB — CYTOLOGY - NON PAP

## 2022-08-22 NOTE — Telephone Encounter (Signed)
Patient informed per [facility] Radiologist recommendation, needs Prolactin level checked (blood draw), and left breast discharge pathology was benign. Patient stated she has visit on 09/06/2022, and needed to know if she could do at that appointment. Per Marliss Coots, NP, she will collect labs at visit, which is no charge, and is aware of pathology results as well, will follow-up as well. I confirmed with patient that I spoke with Marliss Coots and she can do labs at 09/06/2022, will not incur any expenses. Patient verbalized understanding.

## 2022-09-02 LAB — CYTOLOGY - NON PAP

## 2022-09-03 ENCOUNTER — Telehealth: Payer: Self-pay

## 2022-09-03 NOTE — Telephone Encounter (Signed)
Spoke with patient about right breast discharge results. Informed patient that no malignant cells were identified, findings were consistent with a cyst. Screening mammo is recommended in 1 year. Prolactin levels will be drawn on visit with Marliss Coots, NP on 09/06/22 per radiologist's request, Patient voiced understanding.

## 2023-01-20 ENCOUNTER — Ambulatory Visit
Admission: EM | Admit: 2023-01-20 | Discharge: 2023-01-20 | Disposition: A | Discharge status: Home / Self Care | Payer: Medicaid Other | Attending: Urgent Care | Admitting: Urgent Care

## 2023-01-20 DIAGNOSIS — N930 Postcoital and contact bleeding: Secondary | ICD-10-CM | POA: Insufficient documentation

## 2023-01-20 DIAGNOSIS — B3731 Acute candidiasis of vulva and vagina: Secondary | ICD-10-CM

## 2023-01-20 DIAGNOSIS — R102 Pelvic and perineal pain: Secondary | ICD-10-CM | POA: Diagnosis present

## 2023-01-20 DIAGNOSIS — N939 Abnormal uterine and vaginal bleeding, unspecified: Secondary | ICD-10-CM | POA: Insufficient documentation

## 2023-01-20 DIAGNOSIS — N898 Other specified noninflammatory disorders of vagina: Secondary | ICD-10-CM

## 2023-01-20 DIAGNOSIS — N926 Irregular menstruation, unspecified: Secondary | ICD-10-CM | POA: Insufficient documentation

## 2023-01-20 DIAGNOSIS — A64 Unspecified sexually transmitted disease: Secondary | ICD-10-CM | POA: Insufficient documentation

## 2023-01-20 DIAGNOSIS — B009 Herpesviral infection, unspecified: Secondary | ICD-10-CM

## 2023-01-20 HISTORY — DX: Acute candidiasis of vulva and vagina: B37.31

## 2023-01-20 HISTORY — DX: Herpesviral infection, unspecified: B00.9

## 2023-01-20 HISTORY — DX: Postcoital and contact bleeding: N93.0

## 2023-01-20 HISTORY — DX: Unspecified sexually transmitted disease: A64

## 2023-01-20 HISTORY — DX: Other specified noninflammatory disorders of vagina: N89.8

## 2023-01-20 MED ORDER — NAPROXEN 500 MG PO TABS: 500.0000 mg | ORAL_TABLET | Freq: Two times a day (BID) | ORAL | 0 refills | Status: DC

## 2023-01-20 NOTE — ED Triage Notes (Addendum)
Patient presents to [facility] for vaginal bleeding since yesterday. States similar episode last month . She states she has an ob-gyn appt next month. States she is going through a pad every hour.

## 2023-01-20 NOTE — ED Provider Notes (Signed)
[facility]  Note:  This document was prepared using Systems analyst and may include unintentional dictation errors.  MRN: [id number] DOB: Jun 04, 1981  Subjective:   Natalie Colon is a 42 y.o. female presenting for 2 to 3-day history of persistent heavy vaginal bleeding, pelvic cramping.  Patient has had irregular cycles for the past 4 to 5 months.  She does not have concern for sexually transmitted infection but is not opposed to testing.  In fact she would like to make sure it gets done.  She has an IUD, tubal implant.  Reports that she has got mixed information about this in the past.  She does have an OB/GYN.  They have consistently done testing.  Patient has a history of BV and yeast infection.  She would like to hold off on empiric treatment.  No urinary symptoms.  No nausea, vomiting.  Of note, she does report that within the past few years, she had 1 reportedly spontaneous abortion.  She does not want to be tested for urine pregnancy test today.  No current facility-administered medications for this encounter.  Current Outpatient Medications:    Boric Acid Vaginal 600 MG SUPP, Place vaginally., Disp: , Rfl:    fexofenadine (ALLEGRA ALLERGY) 180 MG tablet, Take 1 tablet (180 mg total) by mouth daily for 15 days., Disp: 15 tablet, Rfl: 0   fluconazole (DIFLUCAN) 150 MG tablet, Take 1 tablet (150 mg total) by mouth daily. -For your yeast infection, start the Diflucan (fluconazole)- Take one pill today (day 1). If you're still having symptoms in 3 days, take the second pill. (Patient not taking: Reported on 08/20/2022), Disp: 2 tablet, Rfl: 0   ibuprofen (ADVIL) 600 MG tablet, Take 1 tablet (600 mg total) by mouth every 6 (six) hours as needed. (Patient not taking: Reported on 08/20/2022), Disp: 30 tablet, Rfl: 0   metoprolol tartrate (LOPRESSOR) 50 MG tablet, Take one tablet by mouth one hour prior to CT Scan (Patient not taking: Reported on  08/20/2022), Disp: 1 tablet, Rfl: 0   mometasone (NASONEX) 50 MCG/ACT nasal spray, Place 2 sprays into the nose daily. (Patient not taking: Reported on 08/20/2022), Disp: , Rfl:    naproxen (NAPROSYN) 500 MG tablet, Take 1 tablet (500 mg total) by mouth 2 (two) times daily. (Patient not taking: Reported on 08/20/2022), Disp: 30 tablet, Rfl: 0   ondansetron (ZOFRAN ODT) 4 MG disintegrating tablet, Take 1 tablet (4 mg total) by mouth every 8 (eight) hours as needed for nausea or vomiting. (Patient not taking: Reported on 08/20/2022), Disp: 20 tablet, Rfl: 0   Probiotic Product (PROBIOTIC-10 PO), Take 1 capsule by mouth daily., Disp: , Rfl:    promethazine-dextromethorphan (PROMETHAZINE-DM) 6.25-15 MG/5ML syrup, Take 5 mLs by mouth 4 (four) times daily as needed for cough. (Patient not taking: Reported on 08/20/2022), Disp: 118 mL, Rfl: 0   valACYclovir (VALTREX) 500 MG tablet, Take 500 mg by mouth 2 (two) times daily. (Patient not taking: Reported on 08/20/2022), Disp: , Rfl:    Allergies  Allergen Reactions   Latex Itching   Penicillins Other (See Comments)    unknown   Vicodin [Hydrocodone-Acetaminophen] Other (See Comments)    Heart racing   Hydrocodone-Acetaminophen Palpitations    Past Medical History:  Diagnosis Date   ADHD (attention deficit hyperactivity disorder)    Asthma    Depression    Herpes    Mitral valve prolapse 2010   Echo and workup in [place]  Past Surgical History:  Procedure Laterality Date   CYSTECTOMY     Neck   Pilonidal cyst removal     Tubal implant procedure      Family History  Problem Relation Age of Onset   COPD Mother    Hypertension Mother    Breast cancer Maternal Grandmother    Heart attack Maternal Grandmother    COPD Maternal Grandmother    Heart disease Maternal Grandfather    COPD Maternal Grandfather     Social History   Tobacco Use   Smoking status: Former    Packs/day: 0.20    Years: 10.00    Additional pack years: 0.00     Total pack years: 2.00    Types: Cigarettes    Quit date: 2023    Years since quitting: 1.2   Smokeless tobacco: Never  Vaping Use   Vaping Use: Never used  Substance Use Topics   Alcohol use: Yes    Alcohol/week: 1.0 standard drink of alcohol    Types: 1 Standard drinks or equivalent per week    Comment: socially   Drug use: No    ROS   Objective:   Vitals: BP (!) 133/98 (BP Location: Left Arm)   Pulse 77   Temp 98.2 F (36.8 C) (Oral)   Resp 16   LMP 01/19/2023 (Approximate)   SpO2 96%   Physical Exam Constitutional:      General: She is not in acute distress.    Appearance: Normal appearance. She is well-developed. She is not ill-appearing, toxic-appearing or diaphoretic.  HENT:     Head: Normocephalic and atraumatic.     Nose: Nose normal.     Mouth/Throat:     Mouth: Mucous membranes are moist.     Pharynx: Oropharynx is clear.  Eyes:     General: No scleral icterus.       Right eye: No discharge.        Left eye: No discharge.     Extraocular Movements: Extraocular movements intact.     Conjunctiva/sclera: Conjunctivae normal.  Cardiovascular:     Rate and Rhythm: Normal rate.  Pulmonary:     Effort: Pulmonary effort is normal.  Abdominal:     General: Bowel sounds are normal. There is no distension.     Palpations: Abdomen is soft. There is no mass.     Tenderness: There is no abdominal tenderness. There is no right CVA tenderness, left CVA tenderness, guarding or rebound.  Skin:    General: Skin is warm and dry.  Neurological:     General: No focal deficit present.     Mental Status: She is alert and oriented to person, place, and time.  Psychiatric:        Mood and Affect: Mood normal.        Behavior: Behavior normal.        Thought Content: Thought content normal.        Judgment: Judgment normal.     Assessment and Plan :   PDMP not reviewed this encounter.  1. Pelvic cramping   2. Irregular menstrual cycle   3. Episode of heavy  vaginal bleeding     Discussed differential and ultimately patient declined any kind of empiric treatment.  Recommended continuing with follow-up with her OB/GYN.  Will base treatment off of her results. Counseled patient on potential for adverse effects with medications prescribed/recommended today, ER and return-to-clinic precautions discussed, patient verbalized understanding.    Jaynee Eagles, PA-C 01/20/23  1850  

## 2023-01-21 LAB — CERVICOVAGINAL ANCILLARY ONLY
Bacterial Vaginitis (gardnerella): NEGATIVE
Candida Glabrata: NEGATIVE
Candida Vaginitis: NEGATIVE
Chlamydia: NEGATIVE
Comment: NEGATIVE
Comment: NEGATIVE
Comment: NEGATIVE
Comment: NEGATIVE
Comment: NEGATIVE
Comment: NORMAL
Neisseria Gonorrhea: NEGATIVE
Trichomonas: NEGATIVE

## 2023-05-16 ENCOUNTER — Ambulatory Visit
Admission: EM | Admit: 2023-05-16 | Discharge: 2023-05-16 | Disposition: A | Discharge status: Home / Self Care | Payer: Medicaid Other | Source: Home / Self Care

## 2023-05-16 DIAGNOSIS — R899 Unspecified abnormal finding in specimens from other organs, systems and tissues: Secondary | ICD-10-CM | POA: Diagnosis not present

## 2023-05-16 DIAGNOSIS — Z719 Counseling, unspecified: Secondary | ICD-10-CM | POA: Diagnosis not present

## 2023-05-16 NOTE — Discharge Instructions (Signed)
You do not need any treatment since you have no active symptoms. Please read the attached handout which highlights the reason for pre-surgical testing.

## 2023-05-16 NOTE — ED Provider Notes (Signed)
[facility]    CSN: [id number] Arrival date & time: 05/16/23  1856      History   Chief Complaint Chief Complaint  Patient presents with   Skin Problem    HPI Natalie Colon is a 42 y.o. female.   Patient presents today primarily requesting consultation regarding a lab test her ex-husband recently had.  He is currently being scheduled for a hip replacement, and had a MRSA nasal swab performed in his preop surgical clearance.  It came back positive for MSSA, he was started on mupirocin.  Patient is extremely concerned that she has been around him and might develop an infection.  She does not have any current rashes or skin abnormalities.  She is headed out of town for a funeral this weekend and wanted to make sure she did not get anyone sick.  Patient is asymptomatic and simply wanted counseling regarding his nasal swab surgical clearance test.     Past Medical History:  Diagnosis Date   ADHD (attention deficit hyperactivity disorder)    Asthma    Depression    Herpes    Mitral valve prolapse 2010   Echo and workup in [place]    Patient Active Problem List   Diagnosis Date Noted   Candidiasis of vagina 01/20/2023   Herpes simplex 01/20/2023   Herpesvirus infection 01/20/2023   Postcoital bleeding 01/20/2023   Sexually transmitted disease 01/20/2023   Vaginal discharge 01/20/2023   History of trichomonal vaginitis 12/24/2016   Anxiety 10/03/2015   Fatigue 10/03/2015   Chest pain 05/20/2012   ADD (attention deficit disorder) 07/22/2011   Moderate episode of recurrent major depressive disorder (HCC) 02/27/2011   Allergic rhinitis 01/23/2011   Asthma 01/23/2011   Vitamin D deficiency 10/15/2010   Genital herpes 10/16/2009    Past Surgical History:  Procedure Laterality Date   CYSTECTOMY     Neck   Pilonidal cyst removal     Tubal implant procedure      OB History   No obstetric history on file.      Home Medications    Prior to Admission  medications   Medication Sig Start Date End Date Taking? Authorizing Provider  Boric Acid Vaginal 600 MG SUPP Place vaginally.    [provider]  fexofenadine (ALLEGRA ALLERGY) 180 MG tablet Take 1 tablet (180 mg total) by mouth daily for 15 days. 01/14/22 01/29/22  Trevor Iha, FNP  mometasone (NASONEX) 50 MCG/ACT nasal spray Place 2 sprays into the nose daily. Patient not taking: Reported on 08/20/2022    [provider]  naproxen (NAPROSYN) 500 MG tablet Take 1 tablet (500 mg total) by mouth 2 (two) times daily with a meal. 01/20/23   Wallis Bamberg, PA-C  Probiotic Product (PROBIOTIC-10 PO) Take 1 capsule by mouth daily.    [provider]  valACYclovir (VALTREX) 500 MG tablet Take 500 mg by mouth 2 (two) times daily. Patient not taking: Reported on 08/20/2022    [provider]    Family History Family History  Problem Relation Age of Onset   COPD Mother    Hypertension Mother    Breast cancer Maternal Grandmother    Heart attack Maternal Grandmother    COPD Maternal Grandmother    Heart disease Maternal Grandfather    COPD Maternal Grandfather     Social History Social History   Tobacco Use   Smoking status: Former    Current packs/day: 0.00    Average packs/day: 0.2 packs/day  for 10.0 years (2.0 ttl pk-yrs)    Types: Cigarettes    Start date: 2013    Quit date: 2023    Years since quitting: 1.5   Smokeless tobacco: Never  Vaping Use   Vaping status: Never Used  Substance Use Topics   Alcohol use: Yes    Alcohol/week: 1.0 standard drink of alcohol    Types: 1 Standard drinks or equivalent per week    Comment: socially   Drug use: No     Allergies   Latex, Penicillins, Vicodin [hydrocodone-acetaminophen], and Hydrocodone-acetaminophen   Review of Systems Review of Systems As per HPI  Physical Exam Triage Vital Signs ED Triage Vitals  Encounter Vitals Group     BP 05/16/23 1919 (!) 150/87     Systolic BP Percentile --       Diastolic BP Percentile --      Pulse Rate 05/16/23 1919 86     Resp 05/16/23 1919 16     Temp 05/16/23 1919 99 F (37.2 C)     Temp Source 05/16/23 1919 Oral     SpO2 05/16/23 1919 97 %     Weight --      Height --      Head Circumference --      Peak Flow --      Pain Score 05/16/23 1924 0     Pain Loc --      Pain Education --      Exclude from Growth Chart --    No data found.  Updated Vital Signs BP (!) 150/87 (BP Location: Right Arm)   Pulse 86   Temp 99 F (37.2 C) (Oral)   Resp 16   SpO2 97%   Visual Acuity Right Eye Distance:   Left Eye Distance:   Bilateral Distance:    Right Eye Near:   Left Eye Near:    Bilateral Near:     Physical Exam Vitals and nursing note reviewed.  Constitutional:      General: She is not in acute distress.    Appearance: Normal appearance. She is well-developed.  HENT:     Head: Normocephalic and atraumatic.  Eyes:     General: No scleral icterus.       Right eye: No discharge.        Left eye: No discharge.     Comments: Bilateral xanthelasmas  Cardiovascular:     Rate and Rhythm: Normal rate.  Pulmonary:     Effort: Pulmonary effort is normal. No respiratory distress.     Breath sounds: Normal breath sounds.  Abdominal:     Palpations: Abdomen is soft.  Musculoskeletal:        General: No swelling.     Cervical back: Neck supple.  Skin:    General: Skin is warm and dry.     Coloration: Skin is not jaundiced.     Findings: No bruising, erythema or rash.  Neurological:     General: No focal deficit present.     Mental Status: She is alert and oriented to person, place, and time.  Psychiatric:        Mood and Affect: Mood normal.      UC Treatments / Results  Labs (all labs ordered are listed, but only abnormal results are displayed) Labs Reviewed - No data to display  EKG   Radiology No results found.  Procedures Procedures (including critical care time)  Medications Ordered in UC Medications  - No data to display  Initial Impression / Assessment and Plan / UC Course  I have reviewed the triage vital signs and the nursing notes.  Pertinent labs & imaging results that were available during my care of the patient were reviewed by me and considered in my medical decision making (see chart for details).     Counseling regarding test result -patient is without any skin issues at present time.  She was concerned about exposure to MSSA on a nasal colonization report her husband had prior to surgery.  Discussed with patient that no treatment is indicated at this time for herself.  I did print her off a handout from up-to-date regarding staph aureus decolonization prior to surgery to answer any possible remaining questions she may have. Reassurance provided.   Final Clinical Impressions(s) / UC Diagnoses   Final diagnoses:  Encounter for counseling  Abnormal laboratory test result     Discharge Instructions      You do not need any treatment since you have no active symptoms. Please read the attached handout which highlights the reason for pre-surgical testing.     ED Prescriptions   None    PDMP not reviewed this encounter.   Maretta Bees, [place] 05/16/23 2028

## 2023-05-16 NOTE — ED Triage Notes (Signed)
Pt reports he ex husband tested positive for Staphylococcus aureus not MRS nasal swab 05/12/23 and wants to know if is contagious as she has kids and she is going out of town for a funeral.

## 2023-06-26 ENCOUNTER — Ambulatory Visit
Admission: EM | Admit: 2023-06-26 | Discharge: 2023-06-26 | Disposition: A | Discharge status: Home / Self Care | Payer: Medicaid Other | Attending: Internal Medicine | Admitting: Internal Medicine

## 2023-06-26 ENCOUNTER — Emergency Department (HOSPITAL_COMMUNITY): Payer: Medicaid Other

## 2023-06-26 ENCOUNTER — Encounter (HOSPITAL_COMMUNITY): Payer: Self-pay

## 2023-06-26 ENCOUNTER — Emergency Department (HOSPITAL_COMMUNITY)
Admission: EM | Admit: 2023-06-26 | Discharge: 2023-06-27 | Disposition: A | Discharge status: Home / Self Care | Payer: Medicaid Other | Attending: Emergency Medicine | Admitting: Emergency Medicine

## 2023-06-26 ENCOUNTER — Other Ambulatory Visit: Payer: Self-pay

## 2023-06-26 DIAGNOSIS — Z91040 Latex allergy status: Secondary | ICD-10-CM | POA: Diagnosis not present

## 2023-06-26 DIAGNOSIS — R42 Dizziness and giddiness: Secondary | ICD-10-CM | POA: Insufficient documentation

## 2023-06-26 DIAGNOSIS — R519 Headache, unspecified: Secondary | ICD-10-CM | POA: Insufficient documentation

## 2023-06-26 LAB — CBC WITH DIFFERENTIAL/PLATELET
Abs Immature Granulocytes: 0.02 10*3/uL (ref 0.00–0.07)
Basophils Absolute: 0 10*3/uL (ref 0.0–0.1)
Basophils Relative: 1 %
Eosinophils Absolute: 0.2 10*3/uL (ref 0.0–0.5)
Eosinophils Relative: 2 %
HCT: 40.3 % (ref 36.0–46.0)
Hemoglobin: 12.1 g/dL (ref 12.0–15.0)
Immature Granulocytes: 0 %
Lymphocytes Relative: 35 %
Lymphs Abs: 3 10*3/uL (ref 0.7–4.0)
MCH: 25.8 pg — ABNORMAL LOW (ref 26.0–34.0)
MCHC: 30 g/dL (ref 30.0–36.0)
MCV: 85.9 fL (ref 80.0–100.0)
Monocytes Absolute: 0.6 10*3/uL (ref 0.1–1.0)
Monocytes Relative: 7 %
Neutro Abs: 4.7 10*3/uL (ref 1.7–7.7)
Neutrophils Relative %: 55 %
Platelets: 382 10*3/uL (ref 150–400)
RBC: 4.69 MIL/uL (ref 3.87–5.11)
RDW: 12.8 % (ref 11.5–15.5)
WBC: 8.5 10*3/uL (ref 4.0–10.5)
nRBC: 0 % (ref 0.0–0.2)

## 2023-06-26 LAB — BASIC METABOLIC PANEL
Anion gap: 8 (ref 5–15)
BUN: 16 mg/dL (ref 6–20)
CO2: 25 mmol/L (ref 22–32)
Calcium: 8.9 mg/dL (ref 8.9–10.3)
Chloride: 107 mmol/L (ref 98–111)
Creatinine, Ser: 0.8 mg/dL (ref 0.44–1.00)
GFR, Estimated: >60 mL/min (ref >60)
Glucose, Bld: 108 mg/dL — ABNORMAL HIGH (ref 70–99)
Potassium: 4.1 mmol/L (ref 3.5–5.1)
Sodium: 140 mmol/L (ref 135–145)

## 2023-06-26 LAB — MAGNESIUM: Magnesium: 2.4 mg/dL (ref 1.7–2.4)

## 2023-06-26 LAB — TROPONIN I (HIGH SENSITIVITY): Troponin I (High Sensitivity): 2 ng/L (ref <18)

## 2023-06-26 NOTE — ED Notes (Signed)
Save blue tube in main lab °

## 2023-06-26 NOTE — ED Triage Notes (Addendum)
Pt presents to UC w/ c/o dizziness starting today with vision going dark and feeling pressure on sides of head. Pt reports pain shooting down either arm when she has these blackouts.

## 2023-06-26 NOTE — Discharge Instructions (Signed)
Please go to the the emergency room for further evaluation of your symptoms

## 2023-06-26 NOTE — ED Triage Notes (Signed)
Encouraged to ED from UC for dizziness, paresthesias, and intermittent episodes of shortness of breath.

## 2023-06-26 NOTE — ED Notes (Signed)
Urine sample sent to lab

## 2023-06-26 NOTE — ED Provider Notes (Signed)
[facility]    CSN: [id number] Arrival date & time: 06/26/23  1942      History   Chief Complaint No chief complaint on file.   HPI Natalie Colon is a 42 y.o. female presents for evaluation of dizziness.  Patient reports a week of progressing episodes of dizziness that feels as though everything is spinning and is associated with "blackouts" where her vision goes completely dark and she is unable to see.  Denies any actual syncope during this time.  States it takes about 2 to 3 minutes for her vision to return.  This have intermittent headaches.  She denies any nausea/vomiting.  Does report she occasionally has shooting pain down both arms sometimes 1 sometimes the other.  No chest pain or shortness of breath.  States she has had this in the past and was seen for it but they can never find out if anything was wrong.  States has been at least 2 years since she was evaluated for this.  Denies any change in medications.  She has been staying hydrated.  No recent illnesses.  She had 2 episodes of this today prompting her to come in for evaluation.  HPI  Past Medical History:  Diagnosis Date   ADHD (attention deficit hyperactivity disorder)    Asthma    Depression    Herpes    Mitral valve prolapse 2010   Echo and workup in [place]    Patient Active Problem List   Diagnosis Date Noted   Candidiasis of vagina 01/20/2023   Herpes simplex 01/20/2023   Herpesvirus infection 01/20/2023   Postcoital bleeding 01/20/2023   Sexually transmitted disease 01/20/2023   Vaginal discharge 01/20/2023   History of trichomonal vaginitis 12/24/2016   Anxiety 10/03/2015   Fatigue 10/03/2015   Chest pain 05/20/2012   ADD (attention deficit disorder) 07/22/2011   Moderate episode of recurrent major depressive disorder (HCC) 02/27/2011   Allergic rhinitis 01/23/2011   Asthma 01/23/2011   Vitamin D deficiency 10/15/2010   Genital herpes 10/16/2009    Past Surgical History:   Procedure Laterality Date   CYSTECTOMY     Neck   Pilonidal cyst removal     Tubal implant procedure      OB History   No obstetric history on file.      Home Medications    Prior to Admission medications   Medication Sig Start Date End Date Taking? Authorizing Provider  Boric Acid Vaginal 600 MG SUPP Place vaginally.    [provider]  fexofenadine (ALLEGRA ALLERGY) 180 MG tablet Take 1 tablet (180 mg total) by mouth daily for 15 days. 01/14/22 01/29/22  Trevor Iha, FNP  mometasone (NASONEX) 50 MCG/ACT nasal spray Place 2 sprays into the nose daily. Patient not taking: Reported on 08/20/2022    [provider]  naproxen (NAPROSYN) 500 MG tablet Take 1 tablet (500 mg total) by mouth 2 (two) times daily with a meal. 01/20/23   Wallis Bamberg, PA-C  Probiotic Product (PROBIOTIC-10 PO) Take 1 capsule by mouth daily.    [provider]  valACYclovir (VALTREX) 500 MG tablet Take 500 mg by mouth 2 (two) times daily. Patient not taking: Reported on 08/20/2022    [provider]    Family History Family History  Problem Relation Age of Onset   COPD Mother    Hypertension Mother    Breast cancer Maternal Grandmother    Heart attack Maternal Grandmother    COPD Maternal Grandmother  Heart disease Maternal Grandfather    COPD Maternal Grandfather     Social History Social History   Tobacco Use   Smoking status: Former    Current packs/day: 0.00    Average packs/day: 0.2 packs/day for 10.0 years (2.0 ttl pk-yrs)    Types: Cigarettes    Start date: 2013    Quit date: 2023    Years since quitting: 1.6   Smokeless tobacco: Never  Vaping Use   Vaping status: Never Used  Substance Use Topics   Alcohol use: Yes    Alcohol/week: 1.0 standard drink of alcohol    Types: 1 Standard drinks or equivalent per week    Comment: socially   Drug use: No     Allergies   Latex, Penicillins, Vicodin [hydrocodone-acetaminophen], and  Hydrocodone-acetaminophen   Review of Systems Review of Systems  Neurological:  Positive for dizziness and headaches.     Physical Exam Triage Vital Signs ED Triage Vitals  Encounter Vitals Group     BP 06/26/23 1947 131/86     Systolic BP Percentile --      Diastolic BP Percentile --      Pulse Rate 06/26/23 1947 65     Resp 06/26/23 1947 16     Temp 06/26/23 1947 98.6 F (37 C)     Temp Source 06/26/23 1947 Oral     SpO2 06/26/23 1947 98 %     Weight --      Height --      Head Circumference --      Peak Flow --      Pain Score 06/26/23 1949 4     Pain Loc --      Pain Education --      Exclude from Growth Chart --    No data found.  Updated Vital Signs BP 131/86 (BP Location: Right Arm)   Pulse 65   Temp 98.6 F (37 C) (Oral)   Resp 16   LMP 06/19/2023 (Approximate)   SpO2 98%   Visual Acuity Right Eye Distance:   Left Eye Distance:   Bilateral Distance:    Right Eye Near:   Left Eye Near:    Bilateral Near:     Physical Exam Vitals and nursing note reviewed.  Constitutional:      General: She is not in acute distress.    Appearance: Normal appearance. She is not ill-appearing.  HENT:     Head: Normocephalic and atraumatic.     Right Ear: Tympanic membrane and ear canal normal.     Left Ear: Tympanic membrane and ear canal normal.  Eyes:     Extraocular Movements: Extraocular movements intact.     Conjunctiva/sclera: Conjunctivae normal.     Pupils: Pupils are equal, round, and reactive to light.  Cardiovascular:     Rate and Rhythm: Normal rate and regular rhythm.     Heart sounds: Normal heart sounds.  Pulmonary:     Effort: Pulmonary effort is normal.     Breath sounds: Normal breath sounds.  Skin:    General: Skin is warm and dry.  Neurological:     General: No focal deficit present.     Mental Status: She is alert and oriented to person, place, and time.     GCS: GCS eye subscore is 4. GCS verbal subscore is 5. GCS motor subscore is  6.     Cranial Nerves: No facial asymmetry.     Motor: No weakness or pronator drift.  Coordination: Romberg sign negative.  Psychiatric:        Mood and Affect: Mood normal.        Behavior: Behavior normal.      UC Treatments / Results  Labs (all labs ordered are listed, but only abnormal results are displayed) Labs Reviewed - No data to display  EKG   Radiology No results found.  Procedures ED EKG  Date/Time: 06/26/2023 8:06 PM  Performed by: Radford Pax, NP Authorized by: Radford Pax, NP   ECG interpreted by ED Physician in the absence of a cardiologist: no   Previous ECG:    Previous ECG:  Unavailable Rate:    ECG rate assessment: normal   Rhythm:    Rhythm: sinus rhythm   Ectopy:    Ectopy: none   ST segments:    ST segments:  Normal Comments:     Sinus rhythm with no acute ST-T wave changes or ectopy.  (including critical care time)  Medications Ordered in UC Medications - No data to display  Initial Impression / Assessment and Plan / UC Course  I have reviewed the triage vital signs and the nursing notes.  Pertinent labs & imaging results that were available during my care of the patient were reviewed by me and considered in my medical decision making (see chart for details).     I reviewed exam and symptoms with patient.  Discussed limitations and abilities of urgent care.  EKG without any acute changes.  Neurological exam unremarkable.  Vital signs are stable.  Patient presenting with progressing episodes of dizziness with "blackouts" and vision loss without syncope.  Intermittent headaches as well.  Given her symptoms I did advise she go to the emergency room for further evaluation and treatment.  She is in agreement with plan will go POV with a friend driving to the emergency room.  She was instructed to pull over and call 911 for any worsening symptoms that occur in transit and she verbalized understanding. Final Clinical Impressions(s) / UC  Diagnoses   Final diagnoses:  Dizziness     Discharge Instructions      Please go to the the emergency room for further evaluation of your symptoms    ED Prescriptions   None    PDMP not reviewed this encounter.   Radford Pax, NP 06/26/23 2007

## 2023-06-26 NOTE — ED Provider Triage Note (Signed)
Emergency Medicine Provider Triage Evaluation Note  Natalie Colon , a 42 y.o. female  was evaluated in triage.  Pt complains of dizziness, paresthesias, and some shortness of breath.  Seen at urgent care and referred to the emergency room.  Review of Systems  Positive: As above Negative: As above  Physical Exam  BP 132/89 (BP Location: Left Arm)   Pulse 63   Temp 98.6 F (37 C) (Oral)   Resp 16   Ht 5\' 7"  (1.702 m)   Wt 81.6 kg   LMP 06/19/2023 (Approximate)   SpO2 100%   BMI 28.19 kg/m  Gen:   Awake, no distress   Resp:  Normal effort  MSK:   Moves extremities without difficulty  Other:    Medical Decision Making  Medically screening exam initiated at 9:15 PM.  Appropriate orders placed.  Gillermo Murdoch was informed that the remainder of the evaluation will be completed by another provider, this initial triage assessment does not replace that evaluation, and the importance of remaining in the ED until their evaluation is complete.    Marita [place], PA-C 06/26/23 2115

## 2023-06-27 ENCOUNTER — Emergency Department (HOSPITAL_COMMUNITY): Payer: Medicaid Other

## 2023-06-27 LAB — TROPONIN I (HIGH SENSITIVITY): Troponin I (High Sensitivity): <2 ng/L (ref <18)

## 2023-06-27 MED ORDER — DIPHENHYDRAMINE HCL 25 MG PO CAPS
25.0000 mg | ORAL_CAPSULE | Freq: Once | ORAL | Status: AC
  Administered 2023-06-27: 25 mg via ORAL
  Filled 2023-06-27: qty 1

## 2023-06-27 MED ORDER — PROCHLORPERAZINE MALEATE 10 MG PO TABS
10.0000 mg | ORAL_TABLET | Freq: Once | ORAL | Status: AC
  Administered 2023-06-27: 10 mg via ORAL
  Filled 2023-06-27: qty 1

## 2023-06-27 NOTE — Discharge Instructions (Signed)
Lab workup and imaging are reassuring, recommend continuing with home medications, may use over-the-counter pain medication for headaches.  Recommend staying hydrated, getting plenty of sleep, and decreasing  stress this can help prevent future headaches  I have sent an internal referral to neurology,  if you do not hear from them after weeks time please call their office to schedule follow-up appointments.  Come back to the emergency department if you develop chest pain, shortness of breath, severe abdominal pain, uncontrolled nausea, vomiting, diarrhea.

## 2023-06-27 NOTE — ED Provider Notes (Signed)
[place] EMERGENCY DEPARTMENT AT [facility] Provider Note   CSN: [id number] Arrival date & time: 06/26/23  2019     History  Chief Complaint  Patient presents with   Dizziness    Natalie Colon is a 42 y.o. female.  HPI   Patient with medical history regarding ADHD, mitral valve prolapse, presenting with complaints of headaches.  Patient states yesterday around 3 PM she was in her kitchen, and started to feel some head pressure, she then started feel as if the room was spinning, had blurred vision, and her vision went black where she could not see, this lasted approximately few minutes and then resolved.  She had no associated slurred speech facial droop paresthesias or weakness in the upper or lower extremities.  Patient states that she currently just has slight head pressure but this is normal for her, states that she has had this happen in the past, states about 6 years ago this was more frequent, states that due to not being a threat what is wrong with her.  She admits that she did have an episode of pain shoot down her left arm, currently no chest pain or shortness of breath.  She not having any other complaints.  Home Medications Prior to Admission medications   Medication Sig Start Date End Date Taking? Authorizing Provider  Boric Acid Vaginal 600 MG SUPP Place vaginally.    [provider]  fexofenadine (ALLEGRA ALLERGY) 180 MG tablet Take 1 tablet (180 mg total) by mouth daily for 15 days. 01/14/22 01/29/22  Trevor Iha, FNP  mometasone (NASONEX) 50 MCG/ACT nasal spray Place 2 sprays into the nose daily. Patient not taking: Reported on 08/20/2022    [provider]  naproxen (NAPROSYN) 500 MG tablet Take 1 tablet (500 mg total) by mouth 2 (two) times daily with a meal. 01/20/23   Wallis Bamberg, PA-C  Probiotic Product (PROBIOTIC-10 PO) Take 1 capsule by mouth daily.    [provider]  valACYclovir (VALTREX) 500 MG tablet Take 500 mg by  mouth 2 (two) times daily. Patient not taking: Reported on 08/20/2022    [provider]      Allergies    Latex, Penicillins, Vicodin [hydrocodone-acetaminophen], and Hydrocodone-acetaminophen    Review of Systems   Review of Systems  Constitutional:  Negative for chills and fever.  Respiratory:  Negative for shortness of breath.   Cardiovascular:  Negative for chest pain.  Gastrointestinal:  Negative for abdominal pain.  Neurological:  Positive for headaches.    Physical Exam Updated Vital Signs BP 114/77 (BP Location: Left Arm)   Pulse 70   Temp 98.5 F (36.9 C) (Oral)   Resp 18   Ht 5\' 7"  (1.702 m)   Wt 81.6 kg   LMP 06/19/2023 (Approximate)   SpO2 99%   BMI 28.19 kg/m  Physical Exam Vitals and nursing note reviewed.  Constitutional:      General: She is not in acute distress.    Appearance: She is not ill-appearing.  HENT:     Head: Normocephalic and atraumatic.     Nose: No congestion.  Eyes:     Conjunctiva/sclera: Conjunctivae normal.  Cardiovascular:     Rate and Rhythm: Normal rate and regular rhythm.     Pulses: Normal pulses.     Heart sounds: No murmur heard.    No friction rub. No gallop.  Pulmonary:     Effort: No respiratory distress.     Breath sounds: No wheezing,  rhonchi or rales.  Skin:    General: Skin is warm and dry.  Neurological:     Mental Status: She is alert.     GCS: GCS eye subscore is 4. GCS verbal subscore is 5. GCS motor subscore is 6.     Cranial Nerves: Cranial nerves 2-12 are intact.     Motor: Motor function is intact.     Coordination: Romberg sign negative. Finger-Nose-Finger Test normal.     Gait: Gait is intact.     Comments: Cranial nerves II through XII grossly intact no difficulty with word finding following two-step commands there is no unilateral weakness present.  Gait fully intact.  Psychiatric:        Mood and Affect: Mood normal.     ED Results / Procedures / Treatments   Labs (all labs ordered  are listed, but only abnormal results are displayed) Labs Reviewed  BASIC METABOLIC PANEL - Abnormal; Notable for the following components:      Result Value   Glucose, Bld 108 (*)    All other components within normal limits  CBC WITH DIFFERENTIAL/PLATELET - Abnormal; Notable for the following components:   MCH 25.8 (*)    All other components within normal limits  MAGNESIUM  TROPONIN I (HIGH SENSITIVITY)  TROPONIN I (HIGH SENSITIVITY)    EKG EKG Interpretation Date/Time:  Thursday June 26 2023 21:02:41 EDT Ventricular Rate:  69 PR Interval:  130 QRS Duration:  95 QT Interval:  436 QTC Calculation: 468 R Axis:   6  Text Interpretation: Sinus rhythm Low voltage, precordial leads No significant change was found Confirmed by Drema Pry [phone]) on 06/27/2023 3:36:06 AM  Radiology CT Head Wo Contrast  Result Date: 06/27/2023 CLINICAL DATA:  42 year old female with headache, dizziness, paresthesia. EXAM: CT HEAD WITHOUT CONTRAST TECHNIQUE: Contiguous axial images were obtained from the base of the skull through the vertex without intravenous contrast. RADIATION DOSE REDUCTION: This exam was performed according to the departmental dose-optimization program which includes automated exposure control, adjustment of the mA and/or kV according to patient size and/or use of iterative reconstruction technique. COMPARISON:  None Available. FINDINGS: Brain: Normal cerebral volume. No midline shift, ventriculomegaly, mass effect, evidence of mass lesion, intracranial hemorrhage or evidence of cortically based acute infarction. Gray-white matter differentiation is within normal limits throughout the brain. Vascular: No suspicious intracranial vascular hyperdensity. Skull: Negative. Sinuses/Orbits: Tympanic cavities, Visualized paranasal sinuses and mastoids are clear. Other: Punctate retained metallic foreign body at the left scalp vertex series 4, image 52. Otherwise negative orbit and scalp soft  tissues. IMPRESSION: 1. Normal noncontrast CT appearance of the brain. 2. Punctate retained metallic foreign body at the left scalp vertex. Electronically Signed   By: Odessa Fleming M.D.   On: 06/27/2023 05:21   DG Chest Port 1 View  Result Date: 06/26/2023 CLINICAL DATA:  Dyspnea. EXAM: PORTABLE CHEST 1 VIEW COMPARISON:  January 14, 2022 FINDINGS: The heart size and mediastinal contours are within normal limits. There is mild, stable elevation of the right hemidiaphragm. Mild atelectasis is seen within the left lung base. No pleural effusion or pneumothorax is identified. The visualized skeletal structures are unremarkable. IMPRESSION: Mild left basilar atelectasis. Electronically Signed   By: Aram Candela M.D.   On: 06/26/2023 21:45    Procedures Procedures    Medications Ordered in ED Medications  prochlorperazine (COMPAZINE) tablet 10 mg (10 mg Oral Given 06/27/23 0341)  diphenhydrAMINE (BENADRYL) capsule 25 mg (25 mg Oral Given 06/27/23 0341)  ED Course/ Medical Decision Making/ A&P                                 Medical Decision Making Amount and/or Complexity of Data Reviewed Radiology: ordered.  Risk Prescription drug management.   This patient presents to the ED for concern of dizziness headache, this involves an extensive number of treatment options, and is a complaint that carries with it a high risk of complications and morbidity.  The differential diagnosis includes CVA, intracranial bleed, retinal artery occlusion, cervical spine stenosis, meningitis    Additional history obtained:  Additional history obtained from partner at bedside External records from outside source obtained and reviewed including PCP notes   Co morbidities that complicate the patient evaluation  N/A  Social Determinants of Health:  N/A    Lab Tests:  I Ordered, and personally interpreted labs.  The pertinent results include: CBC is unremarkable, BMP shows glucose of 108, her troponin  is 2, magnesium 2.4,   Imaging Studies ordered:  I ordered imaging studies including chest x-ray, CT head I independently visualized and interpreted imaging which showed chest x-ray unremarkable, CT head negative acute findings I agree with the radiologist interpretation   Cardiac Monitoring:  The patient was maintained on a cardiac monitor.  I personally viewed and interpreted the cardiac monitored which showed an underlying rhythm of: Without signs of ischemia   Medicines ordered and prescription drug management:  I ordered medication including migraine cocktail I have reviewed the patients home medicines and have made adjustments as needed  Critical Interventions:  N/A   Reevaluation:  Presents with dizziness, triage obtained lab workup imaging which I personally reviewed, unremarkable, she has a benign physical exam, possible migraines, will provide a migraine cocktail, further assess with a CT head  Patient was reassessed resting comfortably having no complaints, agreement with discharge at this time  Consultations Obtained:  N/A    Test Considered:  N/A    Rule out low suspicion for internal head bleed and or mass as CT imaging is negative for acute findings.  Low suspicion for CVA she has no focal deficit present my exam.  Low suspicion for dissection of the vertebral or carotid artery as presentation atypical of etiology.  Low suspicion for meningitis as she has no meningeal sign present.  Doubt ACS negative delta troponins EKG without signs of ischemia.  Doubt PE patient is PERC negative     Dispostion and problem list  After consideration of the diagnostic results and the patients response to treatment, I feel that the patent would benefit from discharge.  Dizziness/headaches-unclear etiology, possible patient suffering complex migraines, will have her follow-up with neurology for further evaluation.            Final Clinical Impression(s) /  ED Diagnoses Final diagnoses:  Dizziness    Rx / DC Orders ED Discharge Orders          Ordered    Ambulatory referral to Neurology       Comments: An appointment is requested in approximately: 1 week   06/27/23 0533              Carroll Sage, PA-C 06/27/23 0535    Nira Conn, MD 06/27/23 0730

## 2023-07-03 ENCOUNTER — Ambulatory Visit: Payer: Medicaid Other | Admitting: Neurology

## 2023-07-03 NOTE — Progress Notes (Deleted)
[facility]    Provider:  Dr Lucia Gaskins Requesting Provider: Carroll Sage, PA* Primary Care Provider:  Lavinia Sharps, NP  CC:  dizziness  HPI:  Natalie Colon is a 42 y.o. female here as requested by Carroll Sage, PA* for dizziness. has Chest pain; ADD (attention deficit disorder); Allergic rhinitis; Anxiety; Asthma; Candidiasis of vagina; Fatigue; Genital herpes; Herpes simplex; Herpesvirus infection; History of trichomonal vaginitis; Moderate episode of recurrent major depressive disorder (HCC); Postcoital bleeding; Sexually transmitted disease; Vaginal discharge; and Vitamin D deficiency on their problem list.  She also has ADHD, mitral valve prolapse, headaches  I reviewed ED notes from Dr. Bertram Denver 06/26/2023: She was seen for dizziness, She presented to the ED June 26, 2023 for headaches, the day before she was in her kitchen and started to feel some head pressure, then she felt the room was spinning, had blurred vision and her vision went black where she could not see lasted a few minutes then resolved.  She had no associated slurred speech, facial droop, paresthesias or weakness in the upper or lower extremities.  In the ED she just had slight head pressure but that is normal for her.  This has happened in the past about 6 years ago was more frequent.  Physical exam and neurologic exam was unremarkable, labs unremarkable, she was given Compazine and Benadryl.  CT was negative.  Nonfocal exam.  Low suspicion for other causes.Referred to neuro not her pcp.  Reviewed notes, labs and imaging from outside physicians, which showed ***  Review of Systems: Patient complains of symptoms per HPI as well as the following symptoms ***. Pertinent negatives and positives per HPI. All others negative.   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  Tobacco Use   Smoking status: Former    Current packs/day: 0.00    Average packs/day: 0.2 packs/day for 10.0 years (2.0 ttl pk-yrs)    Types: Cigarettes    Start date: 2013    Quit date: 2023    Years since quitting: 1.6   Smokeless tobacco: Never  Vaping Use   Vaping status: Never Used  Substance and Sexual Activity   Alcohol use: Yes    Alcohol/week: 1.0 standard drink of alcohol    Types: 1 Standard drinks or equivalent per week    Comment: socially   Drug use: No   Sexual activity: Yes    Birth control/protection: Other-see comments  Other Topics Concern   Not on file  Social History Narrative   Not on file   Social Determinants of Health   Financial Resource Strain: Not on file  Food Insecurity: No Food Insecurity (08/20/2022)   Hunger Vital Sign    Worried About Running Out of Food in the Last Year: Never true    Ran Out of Food in the Last Year: Never true  Transportation Needs: No Transportation Needs (08/20/2022)   PRAPARE - Administrator, Civil Service (Medical): No    Lack of Transportation (Non-Medical): No  Physical Activity: Not on file  Stress: Not on file  Social Connections: Unknown (01/20/2023)   Received from [facility]   Social Network    Social Network: Not on file  Intimate  Partner Violence: Unknown (01/20/2023)   Received from [facility]   HITS    Physically Hurt: Not on file    Insult or Talk Down To: Not on file    Threaten Physical Harm: Not on file    Scream or Curse: Not on file    Family History  Problem Relation Age of Onset   COPD Mother    Hypertension Mother    Breast cancer Maternal Grandmother    Heart attack Maternal Grandmother    COPD Maternal Grandmother    Heart disease Maternal Grandfather    COPD Maternal Grandfather     Past Medical History:  Diagnosis Date   ADHD  (attention deficit hyperactivity disorder)    Asthma    Depression    Herpes    Mitral valve prolapse 2010   Echo and workup in [place]    Patient Active Problem List   Diagnosis Date Noted   Candidiasis of vagina 01/20/2023   Herpes simplex 01/20/2023   Herpesvirus infection 01/20/2023   Postcoital bleeding 01/20/2023   Sexually transmitted disease 01/20/2023   Vaginal discharge 01/20/2023   History of trichomonal vaginitis 12/24/2016   Anxiety 10/03/2015   Fatigue 10/03/2015   Chest pain 05/20/2012   ADD (attention deficit disorder) 07/22/2011   Moderate episode of recurrent major depressive disorder (HCC) 02/27/2011   Allergic rhinitis 01/23/2011   Asthma 01/23/2011   Vitamin D deficiency 10/15/2010   Genital herpes 10/16/2009    Past Surgical History:  Procedure Laterality Date   CYSTECTOMY     Neck   Pilonidal cyst removal     Tubal implant procedure      Current Outpatient Medications  Medication Sig Dispense Refill   Boric Acid Vaginal 600 MG SUPP Place vaginally.     fexofenadine (ALLEGRA ALLERGY) 180 MG tablet Take 1 tablet (180 mg total) by mouth daily for 15 days. 15 tablet 0   mometasone (NASONEX) 50 MCG/ACT nasal spray Place 2 sprays into the nose daily. (Patient not taking: Reported on 08/20/2022)     naproxen (NAPROSYN) 500 MG tablet Take 1 tablet (500 mg total) by mouth 2 (two) times daily with a meal. 30 tablet 0   Probiotic Product (PROBIOTIC-10 PO) Take 1 capsule by mouth daily.     valACYclovir (VALTREX) 500 MG tablet Take 500 mg by mouth 2 (two) times daily. (Patient not taking: Reported on 08/20/2022)     No current facility-administered medications for this visit.    Allergies as of 07/03/2023 - Review Complete 06/26/2023  Allergen Reaction Noted   Latex Itching 12/08/2011   Penicillins Other (See Comments) 11/27/2011   Vicodin [hydrocodone-acetaminophen] Other (See Comments) 11/27/2011   Hydrocodone-acetaminophen Palpitations 06/23/2014     Vitals: LMP 06/19/2023 (Approximate)  Last Weight:  Wt Readings from Last 1 Encounters:  06/26/23 180 lb (81.6 kg)   Last Height:   Ht Readings from Last 1 Encounters:  06/26/23 5\' 7"  (1.702 m)     Physical exam: Exam: Gen: NAD, conversant, well nourised, obese, well groomed                     CV: RRR, no MRG. No Carotid Bruits. No peripheral edema, warm, nontender Eyes: Conjunctivae clear without exudates or hemorrhage  Neuro: Detailed Neurologic Exam  Speech:    Speech is normal; fluent and spontaneous with normal comprehension.  Cognition:    The patient is oriented to person, place, and time;     recent and  remote memory intact;     language fluent;     normal attention, concentration,     fund of knowledge Cranial Nerves:    The pupils are equal, round, and reactive to light. The fundi are normal and spontaneous venous pulsations are present. Visual fields are full to finger confrontation. Extraocular movements are intact. Trigeminal sensation is intact and the muscles of mastication are normal. The face is symmetric. The palate elevates in the midline. Hearing intact. Voice is normal. Shoulder shrug is normal. The tongue has normal motion without fasciculations.   Coordination:    Normal finger to nose and heel to shin. Normal rapid alternating movements.   Gait:    Heel-toe and tandem gait are normal.   Motor Observation:    No asymmetry, no atrophy, and no involuntary movements noted. Tone:    Normal muscle tone.    Posture:    Posture is normal. normal erect    Strength:    Strength is V/V in the upper and lower limbs.      Sensation: intact to LT     Reflex Exam:  DTR's:    Deep tendon reflexes in the upper and lower extremities are normal bilaterally.   Toes:    The toes are downgoing bilaterally.   Clonus:    Clonus is absent.    Assessment/Plan:    No orders of the defined types were placed in this encounter.  No orders of the  defined types were placed in this encounter.   Cc: Carroll Sage, PA*,  Placey, Chales Abrahams, NP  Naomie Dean, MD  [facility] [address] [place], [place] [address]  Phone [phone] Fax [phone]

## 2023-07-07 ENCOUNTER — Other Ambulatory Visit: Payer: Self-pay | Admitting: Nurse Practitioner

## 2023-07-07 DIAGNOSIS — Z1231 Encounter for screening mammogram for malignant neoplasm of breast: Secondary | ICD-10-CM

## 2023-07-11 ENCOUNTER — Ambulatory Visit
Admission: EM | Admit: 2023-07-11 | Discharge: 2023-07-11 | Disposition: A | Discharge status: Home / Self Care | Payer: Medicaid Other | Attending: Internal Medicine | Admitting: Internal Medicine

## 2023-07-11 DIAGNOSIS — U071 COVID-19: Secondary | ICD-10-CM | POA: Insufficient documentation

## 2023-07-11 DIAGNOSIS — B349 Viral infection, unspecified: Secondary | ICD-10-CM | POA: Diagnosis present

## 2023-07-11 DIAGNOSIS — J4520 Mild intermittent asthma, uncomplicated: Secondary | ICD-10-CM | POA: Insufficient documentation

## 2023-07-11 MED ORDER — PROMETHAZINE-DM 6.25-15 MG/5ML PO SYRP: 5.0000 mL | ORAL_SOLUTION | Freq: Three times a day (TID) | ORAL | 0 refills | Status: DC | PRN

## 2023-07-11 MED ORDER — ALBUTEROL SULFATE HFA 108 (90 BASE) MCG/ACT IN AERS
1.0000 | INHALATION_SPRAY | Freq: Four times a day (QID) | RESPIRATORY_TRACT | 0 refills | Status: DC | PRN
Start: 2023-07-11 — End: 2023-07-23

## 2023-07-11 MED ORDER — CETIRIZINE HCL 10 MG PO TABS
10.0000 mg | ORAL_TABLET | Freq: Every day | ORAL | 0 refills | Status: AC
Start: 2023-07-11 — End: ?

## 2023-07-11 MED ORDER — PREDNISONE 10 MG PO TABS: 30.0000 mg | ORAL_TABLET | Freq: Every day | ORAL | 0 refills | Status: DC

## 2023-07-11 NOTE — ED Triage Notes (Signed)
Pt c/o cough, head/chest congestion x 3 days-denies fever-c/o chest pain when she breathes in and out-taking mucinex, theraflu-NAD-steady gait

## 2023-07-11 NOTE — ED Provider Notes (Signed)
[facility]  Note:  This document was prepared using Conservation officer, historic buildings and may include unintentional dictation errors.  MRN: [id number] DOB: 24-Oct-1981  Subjective:   Natalie Colon is a 42 y.o. female presenting for 3-day history of acute onset persistent sinus congestion, chest congestion, sensation of fluid in her lungs, coughing fits, fever.  She is experiencing chest pain with inspiration and expiration.  Has been using Mucinex and TheraFlu without relief.  Needs a refill on her albuterol inhaler.  No smoking of any kind including cigarettes, cigars, vaping, marijuana use.  She did have a very difficult time with COVID-19 in January 2022.  Would consider COVID antiviral medications.  No current facility-administered medications for this encounter.  Current Outpatient Medications:    Boric Acid Vaginal 600 MG SUPP, Place vaginally., Disp: , Rfl:    fexofenadine (ALLEGRA ALLERGY) 180 MG tablet, Take 1 tablet (180 mg total) by mouth daily for 15 days., Disp: 15 tablet, Rfl: 0   mometasone (NASONEX) 50 MCG/ACT nasal spray, Place 2 sprays into the nose daily. (Patient not taking: Reported on 08/20/2022), Disp: , Rfl:    naproxen (NAPROSYN) 500 MG tablet, Take 1 tablet (500 mg total) by mouth 2 (two) times daily with a meal., Disp: 30 tablet, Rfl: 0   Probiotic Product (PROBIOTIC-10 PO), Take 1 capsule by mouth daily., Disp: , Rfl:    valACYclovir (VALTREX) 500 MG tablet, Take 500 mg by mouth 2 (two) times daily. (Patient not taking: Reported on 08/20/2022), Disp: , Rfl:    Allergies  Allergen Reactions   Latex Itching   Penicillins Other (See Comments)    unknown   Vicodin [Hydrocodone-Acetaminophen] Other (See Comments)    Heart racing   Hydrocodone-Acetaminophen Palpitations    Past Medical History:  Diagnosis Date   ADHD (attention deficit hyperactivity disorder)    Asthma    Depression    Herpes    Mitral valve prolapse 2010   Echo  and workup in [place]     Past Surgical History:  Procedure Laterality Date   CYSTECTOMY     Neck   Pilonidal cyst removal     Tubal implant procedure      Family History  Problem Relation Age of Onset   COPD Mother    Hypertension Mother    Breast cancer Maternal Grandmother    Heart attack Maternal Grandmother    COPD Maternal Grandmother    Heart disease Maternal Grandfather    COPD Maternal Grandfather     Social History   Tobacco Use   Smoking status: Former    Current packs/day: 0.00    Average packs/day: 0.2 packs/day for 10.0 years (2.0 ttl pk-yrs)    Types: Cigarettes    Start date: 2013    Quit date: 2023    Years since quitting: 1.7   Smokeless tobacco: Never  Vaping Use   Vaping status: Never Used  Substance Use Topics   Alcohol use: Yes    Alcohol/week: 1.0 standard drink of alcohol    Types: 1 Standard drinks or equivalent per week    Comment: socially   Drug use: No    ROS   Objective:   Vitals: BP 109/82 (BP Location: Left Arm)   Pulse 92   Temp 99.1 F (37.3 C) (Oral)   Resp 20   LMP 06/19/2023 (Approximate)   SpO2 96%   Physical Exam Constitutional:      General: She is not in acute  distress.    Appearance: Normal appearance. She is well-developed and normal weight. She is not ill-appearing, toxic-appearing or diaphoretic.  HENT:     Head: Normocephalic and atraumatic.     Right Ear: Tympanic membrane, ear canal and external ear normal. No drainage or tenderness. No middle ear effusion. There is no impacted cerumen. Tympanic membrane is not erythematous or bulging.     Left Ear: Tympanic membrane, ear canal and external ear normal. No drainage or tenderness.  No middle ear effusion. There is no impacted cerumen. Tympanic membrane is not erythematous or bulging.     Nose: Congestion present. No rhinorrhea.     Mouth/Throat:     Mouth: Mucous membranes are moist. No oral lesions.     Pharynx: No pharyngeal swelling, oropharyngeal  exudate, posterior oropharyngeal erythema or uvula swelling.     Tonsils: No tonsillar exudate or tonsillar abscesses.  Eyes:     General: No scleral icterus.       Right eye: No discharge.        Left eye: No discharge.     Extraocular Movements: Extraocular movements intact.     Right eye: Normal extraocular motion.     Left eye: Normal extraocular motion.     Conjunctiva/sclera: Conjunctivae normal.  Cardiovascular:     Rate and Rhythm: Normal rate and regular rhythm.     Heart sounds: Normal heart sounds. No murmur heard.    No friction rub. No gallop.  Pulmonary:     Effort: Pulmonary effort is normal. No respiratory distress.     Breath sounds: No stridor. No wheezing, rhonchi or rales.  Chest:     Chest wall: No tenderness.  Musculoskeletal:     Cervical back: Normal range of motion and neck supple.  Lymphadenopathy:     Cervical: No cervical adenopathy.  Skin:    General: Skin is warm and dry.  Neurological:     General: No focal deficit present.     Mental Status: She is alert and oriented to person, place, and time.  Psychiatric:        Mood and Affect: Mood normal.        Behavior: Behavior normal.     Assessment and Plan :   PDMP not reviewed this encounter.  1. Acute viral syndrome   2. Mild intermittent asthma, uncomplicated    Deferred imaging given clear cardiopulmonary exam, hemodynamically stable vital signs.  Recommended on oral prednisone course for her severe respiratory symptoms in the context of allergic rhinitis and asthma.  Refilled her albuterol inhaler.  Use supportive care otherwise.  COVID-19 testing pending.  Counseled patient on potential for adverse effects with medications prescribed/recommended today, ER and return-to-clinic precautions discussed, patient verbalized understanding.  Patient is considering Paxlovid, can start this medication at the latest Sunday if she test positive.  She will let us know.   Wallis Bamberg, [place] 07/11/23  1304

## 2023-07-11 NOTE — Discharge Instructions (Signed)
We will notify you of your test results as they arrive and may take between about 24 hours.  I encourage you to sign up for MyChart if you have not already done so as this can be the easiest way for [place] to communicate results to you online or through a phone app.  Generally, we only contact you if it is a positive test result.  In the meantime, if you develop worsening symptoms including fever, chest pain, shortness of breath despite our current treatment plan then please report to the emergency room as this may be a sign of worsening status from possible viral infection.  Otherwise, we will manage this as a viral syndrome. For sore throat or cough try using a honey-based tea. Use 3 teaspoons of honey with juice squeezed from half lemon. Place shaved pieces of ginger into 1/2-1 cup of water and warm over stove top. Then mix the ingredients and repeat every 4 hours as needed. Please take Tylenol 650mg  every 6 hours for aches and pains, fevers. Hydrate very well with at least 2 liters of water. Eat light meals such as soups to replenish electrolytes and soft fruits, veggies. Start an antihistamine like Zyrtec (10mg  daily) for postnasal drainage, sinus congestion.  You can take this together with prednisone and albuterol.  Use the cough medications as needed.

## 2023-07-12 LAB — SARS CORONAVIRUS 2 (TAT 6-24 HRS): SARS Coronavirus 2: POSITIVE — AB

## 2023-07-16 ENCOUNTER — Ambulatory Visit: Payer: Medicaid Other | Admitting: Cardiology

## 2023-07-17 ENCOUNTER — Encounter: Payer: Self-pay | Admitting: Neurology

## 2023-07-17 ENCOUNTER — Telehealth: Payer: Self-pay | Admitting: Neurology

## 2023-07-17 ENCOUNTER — Ambulatory Visit: Payer: Medicaid Other | Admitting: Neurology

## 2023-07-17 VITALS — Ht 67.0 in | Wt 193.0 lb

## 2023-07-17 DIAGNOSIS — R42 Dizziness and giddiness: Secondary | ICD-10-CM

## 2023-07-17 DIAGNOSIS — R55 Syncope and collapse: Secondary | ICD-10-CM | POA: Diagnosis not present

## 2023-07-17 DIAGNOSIS — H547 Unspecified visual loss: Secondary | ICD-10-CM | POA: Diagnosis not present

## 2023-07-17 DIAGNOSIS — M5412 Radiculopathy, cervical region: Secondary | ICD-10-CM

## 2023-07-17 DIAGNOSIS — M79602 Pain in left arm: Secondary | ICD-10-CM

## 2023-07-17 DIAGNOSIS — M79601 Pain in right arm: Secondary | ICD-10-CM

## 2023-07-17 DIAGNOSIS — R519 Headache, unspecified: Secondary | ICD-10-CM | POA: Diagnosis not present

## 2023-07-17 DIAGNOSIS — G8929 Other chronic pain: Secondary | ICD-10-CM

## 2023-07-17 NOTE — Progress Notes (Addendum)
[facility]    Provider:  Dr Lucia Gaskins Requesting Provider: Carroll Sage, PA* Primary Care Provider:  Eather Colas, FNP  CC:  dizziness  HPI:  Natalie Colon is a 42 y.o. female here as requested by Carroll Sage, PA* for dizziness. has Chest pain; ADD (attention deficit disorder); Allergic rhinitis; Anxiety; Asthma; Candidiasis of vagina; Fatigue; Genital herpes; Herpes simplex; Herpesvirus infection; History of trichomonal vaginitis; Moderate episode of recurrent major depressive disorder (HCC); Postcoital bleeding; Sexually transmitted disease; Vaginal discharge; and Vitamin D deficiency on their problem list.also adhd, asthma, depression, MV prolapse. Could be vasovagal due to pain will try to find the pain, she has trigger points in the left trap area could be muscular.    I reviewed ED notes from Dr. Bertram Denver 06/26/2023: She was seen for dizziness, She presented to the ED June 26, 2023 for headaches, the day before she was in her kitchen and started to feel some head pressure, then she felt the room was spinning, had blurred vision and her vision went black where she could not see lasted a few minutes then resolved.  She had no associated slurred speech, facial droop, paresthesias or weakness in the upper or lower extremities.  In the ED she just had slight head pressure but that is normal for her.  This has happened in the past about 6 years ago was more frequent.  Physical exam and neurologic exam was unremarkable, labs unremarkable, she was given Compazine and Benadryl.  CT head was negative has metal subcutaneous per CT may not be compatible with MRI however CT was brain normal.  Nonfocal exam.  Low suspicion for other causes.Referred to neuro not her pcp.  Episodes for years, can just pass if she holds onto something, not orthostatic today, her vision dims, no loss of consciousness, pain in her arms and pressure in the head resolves completely in between.  She has a sharp pain down her arm with head pressure, she goes dark in vision, boyfriend says no swaying, she can hear everything, not passing out, she has woken up out of her sleep with the sharp pain. She doesn't know exactly where it radiates not all the way down her arm possibly ventral arm, massaging helps, vision loss is brief the arm pain can last longer one sharp shooting pain. Ongoing for years. [place]knwon if trigger can happen almost anytime. Ongoing years years ago 5-6 years started. Not often. 4x in the past 2 months with increased anxiety levels. Extremely painful she can cry. She is seeing cardiology and sw them in the past. Vasovagal caused by pain?   Reviewed notes, labs and imaging from outside physicians, which showed:  06/27/2023: CLINICAL DATA:  42 year old female with headache, dizziness, paresthesia.   EXAM: CT HEAD WITHOUT CONTRAST   TECHNIQUE: Contiguous axial images were obtained from the base of the skull through the vertex without intravenous contrast.   RADIATION DOSE REDUCTION: This exam was performed according to the departmental dose-optimization program which includes automated exposure control, adjustment of the mA and/or kV according to patient size and/or use of iterative reconstruction technique.   COMPARISON:  None Available.   FINDINGS: Brain: Normal cerebral volume. No midline shift, ventriculomegaly, mass effect, evidence of mass lesion, intracranial hemorrhage or evidence of cortically based acute infarction. Gray-white matter differentiation is within normal limits throughout the brain.   Vascular: No suspicious intracranial vascular hyperdensity.   Skull: Negative.   Sinuses/Orbits: Tympanic cavities, Visualized paranasal sinuses and mastoids are clear.  Other: Punctate retained metallic foreign body at the left scalp vertex series 4, image 52. Otherwise negative orbit and scalp soft tissues.   IMPRESSION: 1. Normal noncontrast CT  appearance of the brain. 2. Punctate retained metallic foreign body at the left scalp vertex.  Recent Results (from the past 2160 hour(s))  Basic metabolic panel     Status: Abnormal   Collection Time: 06/26/23  9:16 PM  Result Value Ref Range   Sodium 140 135 - 145 mmol/L   Potassium 4.1 3.5 - 5.1 mmol/L   Chloride 107 98 - 111 mmol/L   CO2 25 22 - 32 mmol/L   Glucose, Bld 108 (H) 70 - 99 mg/dL    Comment: Glucose reference range applies only to samples taken after fasting for at least 8 hours.   BUN 16 6 - 20 mg/dL   Creatinine, Ser 6.04 0.44 - 1.00 mg/dL   Calcium 8.9 8.9 - 54.0 mg/dL   GFR, Estimated >98 >11 mL/min    Comment: (NOTE) Calculated using the CKD-EPI Creatinine Equation (2021)    Anion gap 8 5 - 15    Comment: Performed at [facility], [address], [place], [place] [address]  CBC with Differential/Platelet     Status: Abnormal   Collection Time: 06/26/23  9:16 PM  Result Value Ref Range   WBC 8.5 4.0 - 10.5 K/uL   RBC 4.69 3.87 - 5.11 MIL/uL   Hemoglobin 12.1 12.0 - 15.0 g/dL   HCT 29.5 62.1 - 30.8 %   MCV 85.9 80.0 - 100.0 fL   MCH 25.8 (L) 26.0 - 34.0 pg   MCHC 30.0 30.0 - 36.0 g/dL   RDW 65.7 84.6 - 96.2 %   Platelets 382 150 - 400 K/uL   nRBC 0.0 0.0 - 0.2 %   Neutrophils Relative % 55 %   Neutro Abs 4.7 1.7 - 7.7 K/uL   Lymphocytes Relative 35 %   Lymphs Abs 3.0 0.7 - 4.0 K/uL   Monocytes Relative 7 %   Monocytes Absolute 0.6 0.1 - 1.0 K/uL   Eosinophils Relative 2 %   Eosinophils Absolute 0.2 0.0 - 0.5 K/uL   Basophils Relative 1 %   Basophils Absolute 0.0 0.0 - 0.1 K/uL   Immature Granulocytes 0 %   Abs Immature Granulocytes 0.02 0.00 - 0.07 K/uL    Comment: Performed at [facility], [address], [place], [place] [address]  Magnesium     Status: None   Collection Time: 06/26/23  9:16 PM  Result Value Ref Range   Magnesium 2.4 1.7 - 2.4 mg/dL    Comment: Performed at [facility],  [address], [place], [place] [address]  Troponin I (High Sensitivity)     Status: None   Collection Time: 06/26/23  9:16 PM  Result Value Ref Range   Troponin I (High Sensitivity) 2 <18 ng/L    Comment: (NOTE) Elevated high sensitivity troponin I (hsTnI) values and significant  changes across serial measurements may suggest ACS but many other  chronic and acute conditions are known to elevate hsTnI results.  Refer to the "Links" section for chest pain algorithms and additional  guidance. Performed at [facility] [address] [place], [place] [address]   Troponin I (High Sensitivity)     Status: None   Collection Time: 06/27/23  2:58 AM  Result Value Ref Range   Troponin I (High Sensitivity) <2 <18 ng/L  Comment: (NOTE) Elevated high sensitivity troponin I (hsTnI) values and significant  changes across serial measurements may suggest ACS but many other  chronic and acute conditions are known to elevate hsTnI results.  Refer to the "Links" section for chest pain algorithms and additional  guidance. Performed at [facility], [address], [place], [place] [address]   SARS CORONAVIRUS 2 (TAT 6-24 HRS) Anterior Nasal Swab     Status: Abnormal   Collection Time: 07/11/23 12:59 PM   Specimen: Anterior Nasal Swab  Result Value Ref Range   SARS Coronavirus 2 POSITIVE (A) NEGATIVE    Comment: (NOTE) SARS-CoV-2 target nucleic acids are DETECTED.  The SARS-CoV-2 RNA is generally detectable in upper and lower respiratory specimens during the acute phase of infection. Positive results are indicative of the presence of SARS-CoV-2 RNA. Clinical correlation with patient history and other diagnostic information is  necessary to determine patient infection status. Positive results do not rule out bacterial infection or co-infection with other viruses.  The expected result is Negative.  Fact Sheet for  Patients: HairSlick.no  Fact Sheet for Healthcare Providers: quierodirigir.com  This test is not yet approved or cleared by the [place] FDA and  has been authorized for detection and/or diagnosis of SARS-CoV-2 by FDA under an Emergency Use Authorization (EUA). This EUA will remain  in effect (meaning this test can be used) for the duration of the COVID-19 declaration under Section 564(b)(1) of the Act, 21 U. S.C. section 360bbb-3(b)(1), unless the authorization is terminated or revoked sooner.   Performed at [facility], [address], [place], [place] [address]        Review of Systems: Patient complains of symptoms per HPI as well as the following symptoms: loss of vision, arm shooting pain, vertigo. Pertinent negatives and positives per HPI. All others negative.   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  Tobacco Use   Smoking status: Former    Current packs/day: 0.00    Average packs/day: 0.2 packs/day for 10.0 years (2.0 ttl pk-yrs)    Types: Cigarettes    Start date: 2013    Quit date: 2023    Years since quitting: 1.7   Smokeless tobacco: Never  Vaping Use   Vaping status: Never Used  Substance and Sexual Activity   Alcohol use: Yes    Comment: socially, not even one drink a week   Drug use: No   Sexual activity: Yes    Birth control/protection: Other-see comments  Other Topics Concern   Not on file  Social History Narrative   Caffeine: none   Social Determinants of Health   Financial Resource Strain: Not on file  Food Insecurity: No Food Insecurity (08/20/2022)   Hunger Vital Sign    Worried About Running Out of Food in the Last Year: Never true    Ran Out of Food in the  Last Year: Never true  Transportation Needs: No Transportation Needs (08/20/2022)   PRAPARE - Administrator, Civil Service (Medical): No    Lack of Transportation (Non-Medical): No  Physical Activity: Not on file  Stress: Not on file  Social Connections: Unknown (07/11/2023)   Received from [facility]   Social Network  Social Network: Not on file  Intimate Partner Violence: Unknown (07/11/2023)   Received from [facility]   HITS    Physically Hurt: Not on file    Insult or Talk Down To: Not on file    Threaten Physical Harm: Not on file    Scream or Curse: Not on file    Family History  Problem Relation Age of Onset   COPD Mother    Hypertension Mother    Glaucoma Father    High Cholesterol Father    Breast cancer Maternal Grandmother    Heart attack Maternal Grandmother    COPD Maternal Grandmother    Heart disease Maternal Grandfather    COPD Maternal Grandfather     Past Medical History:  Diagnosis Date   ADHD (attention deficit hyperactivity disorder)    Asthma    Depression    Herpes    Mitral valve prolapse 2010   Echo and workup in [place]    Patient Active Problem List   Diagnosis Date Noted   Candidiasis of vagina 01/20/2023   Herpes simplex 01/20/2023   Herpesvirus infection 01/20/2023   Postcoital bleeding 01/20/2023   Sexually transmitted disease 01/20/2023   Vaginal discharge 01/20/2023   History of trichomonal vaginitis 12/24/2016   Anxiety 10/03/2015   Fatigue 10/03/2015   Chest pain 05/20/2012   ADD (attention deficit disorder) 07/22/2011   Moderate episode of recurrent major depressive disorder (HCC) 02/27/2011   Allergic rhinitis 01/23/2011   Asthma 01/23/2011   Vitamin D deficiency 10/15/2010   Genital herpes 10/16/2009    Past Surgical History:  Procedure Laterality Date   CYSTECTOMY     Neck   Pilonidal cyst removal     Tubal implant procedure      Current Outpatient Medications  Medication Sig Dispense  Refill   albuterol (VENTOLIN HFA) 108 (90 Base) MCG/ACT inhaler Inhale 1-2 puffs into the lungs every 6 (six) hours as needed for wheezing or shortness of breath. 18 g 0   Boric Acid Vaginal 600 MG SUPP Place vaginally. As needed     cetirizine (ZYRTEC ALLERGY) 10 MG tablet Take 1 tablet (10 mg total) by mouth daily. 30 tablet 0   fexofenadine (ALLEGRA ALLERGY) 180 MG tablet Take 1 tablet (180 mg total) by mouth daily for 15 days. (Patient taking differently: Take 180 mg by mouth daily. AS NEEDED) 15 tablet 0   mometasone (NASONEX) 50 MCG/ACT nasal spray Place 2 sprays into the nose daily. As needed     naproxen (NAPROSYN) 500 MG tablet Take 1 tablet (500 mg total) by mouth 2 (two) times daily with a meal. (Patient taking differently: Take 500 mg by mouth 2 (two) times daily with a meal. As needed) 30 tablet 0   predniSONE (DELTASONE) 10 MG tablet Take 3 tablets (30 mg total) by mouth daily with breakfast. 15 tablet 0   Probiotic Product (PROBIOTIC-10 PO) Take 1 capsule by mouth daily.     No current facility-administered medications for this visit.    Allergies as of 07/17/2023 - Review Complete 07/17/2023  Allergen Reaction Noted   Latex Itching 12/08/2011   Penicillins Other (See Comments) 11/27/2011   Vicodin [hydrocodone-acetaminophen] Other (See Comments) 11/27/2011   Hydrocodone-acetaminophen Palpitations 06/23/2014    Vitals: Ht 5\' 7"  (1.702 m)   Wt 193 lb (87.5 kg)   LMP 06/19/2023 (Approximate)   BMI 30.23 kg/m  Last Weight:  Wt Readings from Last 1 Encounters:  07/17/23 193 lb (87.5 kg)   Last  Height:   Ht Readings from Last 1 Encounters:  07/17/23 5\' 7"  (1.702 m)     Physical exam: Exam: Gen: NAD, conversant, well nourised, obese, well groomed                     CV: RRR, no MRG. No Carotid Bruits. No peripheral edema, warm, nontender Eyes: Conjunctivae clear without exudates or hemorrhage  Neuro: Detailed Neurologic Exam  Speech:    Speech is normal; fluent  and spontaneous with normal comprehension.  Cognition:    The patient is oriented to person, place, and time;     recent and remote memory intact;     language fluent;     normal attention, concentration,     fund of knowledge Cranial Nerves:    The pupils are equal, round, and reactive to light. The fundi are normal and spontaneous venous pulsations are present. Visual fields are full to finger confrontation. Extraocular movements are intact. Trigeminal sensation is intact and the muscles of mastication are normal. The face is symmetric. The palate elevates in the midline. Hearing intact. Voice is normal. Shoulder shrug is normal. The tongue has normal motion without fasciculations.   Coordination: nml  Gait: nml  Motor Observation:    No asymmetry, no atrophy, and no involuntary movements noted. Tone:    Normal muscle tone.    Posture:    Posture is normal. normal erect    Strength:    Strength is V/V in the upper and lower limbs.      Sensation: intact to LT     Reflex Exam:  DTR's:    Deep tendon reflexes in the upper and lower extremities are normal bilaterally.   Toes:    The toes are downgoing bilaterally.   Clonus:    Clonus is absent.    Assessment/Plan:  Discussed in detail, reviewed images online including dermatomes, myotomes,: MRI cervical spine to see if the pain radiating down your arm is a pinched nerve and maybe that pain causes the symptoms; she has a sharp severe pain down her arms and then has vasovagal symptoms possibly secondary to pain would like to examine cause of her pain to evaluate for treatment and possibly see if the sequlae of vision dimming and loss may be vasovagal. Patient called back after appointment and asked we put into note: "Patient would like additional notes added to her visit with Dr. Lucia Gaskins. She was a premie born 4 weeks early. She was a premie because she was almost 4 pounds. She also was kept in the hospital because she was unable to  suck and drank with a lambs nipple. She was kept her to gain wight before released. " I did not speak with her regarding this it was sent to me and this is copied from telephone message.  Exam is normal. Patrtner here with her and provides much information.   - EEG to evaluate for seizures: Addendum, abnormal EEG :  This is an abnormal EEG recording in the waking and sleeping state due to presence of generalized epileptiform discharges. 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 Will follow up and posisble treat. - MRI cervical spine to see if the pain radiating down your arm is a pinched nerve and maybe that pain causes the symptoms; she has a sharp severe pain down her arms and then has vasovagal symptoms possibly secondary to pain would like to examine cause of her pain to evaluate for treatment and  possibly see if the sequlae of vision dimming and loss may be vasovagal due to pain; discussed myotomes possibly C5 per her decsription. - I discussed [place] law where she may not be able to drive if this happens while sitting and driving for 6 months she requested this is a letter she can provide my note if needed and the [place] online page. This does not mean she cannot work, she would have to find a way to get to work instead of her driving.  - not orthostatic today Orthostatic BP  116/74 128/78  BP Location  Left Arm Left Arm  Patient Position  Supine Standing  Orthostatic Pulse  67 74  Weight 193 lb (87.5 kg)    Height 5\' 7"  (1.702 m)        MRI cervical spine If negative would consider ortho for shoulder it comes from that area.   Orders Placed This Encounter  Procedures   MR CERVICAL SPINE WO CONTRAST   No orders of the defined types were placed in this encounter.   Cc: Carroll Sage, PA*,  Eather Colas, FNP  Naomie Dean, MD  [facility] [address] [place], [place] [address]  Phone [phone] Fax  [phone]

## 2023-07-17 NOTE — Telephone Encounter (Signed)
Patient would like additional notes added to her visit with Dr. Lucia Gaskins. She was a premie born 4 weeks early. She was a premie because she was almost 4 pounds. She also was kept in the hospital because she was unable to suck and drank with a lambs nipple. She was kept her to gain wight before released.

## 2023-07-17 NOTE — Patient Instructions (Addendum)
MRI cervical spine to see if the pain radiating down your arm is a pinched nerve and maybe that pain causes the symptoms following

## 2023-07-20 ENCOUNTER — Encounter: Payer: Self-pay | Admitting: Neurology

## 2023-07-21 ENCOUNTER — Telehealth: Payer: Self-pay | Admitting: Neurology

## 2023-07-21 NOTE — Telephone Encounter (Signed)
Thank you :)

## 2023-07-21 NOTE — Telephone Encounter (Signed)
Healthy blue Berkley Harvey: [id number] exp. 07/21/23-09/18/23 sent to GI [phone]

## 2023-07-22 ENCOUNTER — Other Ambulatory Visit: Payer: Self-pay

## 2023-07-22 DIAGNOSIS — F32A Depression, unspecified: Secondary | ICD-10-CM | POA: Insufficient documentation

## 2023-07-22 DIAGNOSIS — F909 Attention-deficit hyperactivity disorder, unspecified type: Secondary | ICD-10-CM | POA: Insufficient documentation

## 2023-07-23 ENCOUNTER — Ambulatory Visit: Payer: Medicaid Other | Attending: Cardiology | Admitting: Cardiology

## 2023-07-23 ENCOUNTER — Ambulatory Visit: Payer: Medicaid Other | Attending: Cardiology

## 2023-07-23 ENCOUNTER — Encounter: Payer: Self-pay | Admitting: Cardiology

## 2023-07-23 VITALS — BP 116/74 | HR 88 | Ht 67.0 in | Wt 199.0 lb

## 2023-07-23 DIAGNOSIS — R002 Palpitations: Secondary | ICD-10-CM | POA: Insufficient documentation

## 2023-07-23 DIAGNOSIS — Z13220 Encounter for screening for lipoid disorders: Secondary | ICD-10-CM

## 2023-07-23 DIAGNOSIS — H0263 Xanthelasma of right eye, unspecified eyelid: Secondary | ICD-10-CM

## 2023-07-23 DIAGNOSIS — R0683 Snoring: Secondary | ICD-10-CM | POA: Diagnosis not present

## 2023-07-23 DIAGNOSIS — R011 Cardiac murmur, unspecified: Secondary | ICD-10-CM | POA: Insufficient documentation

## 2023-07-23 DIAGNOSIS — R079 Chest pain, unspecified: Secondary | ICD-10-CM

## 2023-07-23 DIAGNOSIS — E669 Obesity, unspecified: Secondary | ICD-10-CM | POA: Diagnosis not present

## 2023-07-23 DIAGNOSIS — H0266 Xanthelasma of left eye, unspecified eyelid: Secondary | ICD-10-CM

## 2023-07-23 DIAGNOSIS — H0260 Xanthelasma of unspecified eye, unspecified eyelid: Secondary | ICD-10-CM | POA: Insufficient documentation

## 2023-07-23 NOTE — Patient Instructions (Signed)
Medication Instructions:  Your physician recommends that you continue on your current medications as directed. Please refer to the Current Medication list given to you today.  *If you need a refill on your cardiac medications before your next appointment, please call your pharmacy*   Lab Work: Your physician recommends that you return for lab work in: the next few days for CMP, TSH and lipids. You need to have labs done when you are fasting. [facility] lab is located on the 3rd floor, [address]. Hours are Monday - Friday 8 am to 4 pm, closed 11:30 am to 1:00 pm. You do NOT need an appointment.   If you have labs (blood work) drawn today and your tests are completely normal, you will receive your results only by: MyChart Message (if you have MyChart) OR A paper copy in the mail If you have any lab test that is abnormal or we need to change your treatment, we will call you to review the results.   Testing/Procedures: Your physician has requested that you have an echocardiogram. Echocardiography is a painless test that uses sound waves to create images of your heart. It provides your doctor with information about the size and shape of your heart and how well your heart's chambers and valves are working. This procedure takes approximately one hour. There are no restrictions for this procedure. Please do NOT wear cologne, perfume, aftershave, or lotions (deodorant is allowed). Please arrive 15 minutes prior to your appointment time.  Your physician has recommended that you have a sleep study. This test records several body functions during sleep, including: brain activity, eye movement, oxygen and carbon dioxide blood levels, heart rate and rhythm, breathing rate and rhythm, the flow of air through your mouth and nose, snoring, body muscle movements, and chest and belly movement.  Once pre-certed we will call you to get the device.  A zio monitor was ordered today. It will remain on for 14 days.  Remove 08/06/2023. You will then return monitor and event diary in provided box. It takes 1-2 weeks for report to be downloaded and returned to [place]. We will call you with the results. If monitor falls off or has orange flashing light, please call Zio for further instructions.     Follow-Up: At [facility], you and your health needs are our priority.  As part of our continuing mission to provide you with exceptional heart care, we have created designated Provider Care Teams.  These Care Teams include your primary Cardiologist (physician) and Advanced Practice Providers (APPs -  Physician Assistants and Nurse Practitioners) who all work together to provide you with the care you need, when you need it.  We recommend signing up for the patient portal called "MyChart".  Sign up information is provided on this After Visit Summary.  MyChart is used to connect with patients for Virtual Visits (Telemedicine).  Patients are able to view lab/test results, encounter notes, upcoming appointments, etc.  Non-urgent messages can be sent to your provider as well.   To learn more about what you can do with MyChart, go to [email or web address].    Your next appointment:   3 month(s)  The format for your next appointment:   In Person  Provider:   Belva Crome, MD   Other Instructions Echocardiogram An echocardiogram is a test that uses sound waves (ultrasound) to produce images of the heart. Images from an echocardiogram can provide important information about: Heart size and shape. The size and thickness and movement of  your heart's walls. Heart muscle function and strength. Heart valve function or if you have stenosis. Stenosis is when the heart valves are too narrow. If blood is flowing backward through the heart valves (regurgitation). A tumor or infectious growth around the heart valves. Areas of heart muscle that are not working well because of poor blood flow or injury from a heart  attack. Aneurysm detection. An aneurysm is a weak or damaged part of an artery wall. The wall bulges out from the normal force of blood pumping through the body. Tell a health care provider about: Any allergies you have. All medicines you are taking, including vitamins, herbs, eye drops, creams, and over-the-counter medicines. Any blood disorders you have. Any surgeries you have had. Any medical conditions you have. Whether you are pregnant or may be pregnant. What are the risks? Generally, this is a safe test. However, problems may occur, including an allergic reaction to dye (contrast) that may be used during the test. What happens before the test? No specific preparation is needed. You may eat and drink normally. What happens during the test? You will take off your clothes from the waist up and put on a hospital gown. Electrodes or electrocardiogram (ECG)patches may be placed on your chest. The electrodes or patches are then connected to a device that monitors your heart rate and rhythm. You will lie down on a table for an ultrasound exam. A gel will be applied to your chest to help sound waves pass through your skin. A handheld device, called a transducer, will be pressed against your chest and moved over your heart. The transducer produces sound waves that travel to your heart and bounce back (or "echo" back) to the transducer. These sound waves will be captured in real-time and changed into images of your heart that can be viewed on a video monitor. The images will be recorded on a computer and reviewed by your health care provider. You may be asked to change positions or hold your breath for a short time. This makes it easier to get different views or better views of your heart. In some cases, you may receive contrast through an IV in one of your veins. This can improve the quality of the pictures from your heart. The procedure may vary among health care providers and hospitals.   What  can I expect after the test? You may return to your normal, everyday life, including diet, activities, and medicines, unless your health care provider tells you not to do that. Follow these instructions at home: It is up to you to get the results of your test. Ask your health care provider, or the department that is doing the test, when your results will be ready. Keep all follow-up visits. This is important. Summary An echocardiogram is a test that uses sound waves (ultrasound) to produce images of the heart. Images from an echocardiogram can provide important information about the size and shape of your heart, heart muscle function, heart valve function, and other possible heart problems. You do not need to do anything to prepare before this test. You may eat and drink normally. After the echocardiogram is completed, you may return to your normal, everyday life, unless your health care provider tells you not to do that. This information is not intended to replace advice given to you by your health care provider. Make sure you discuss any questions you have with your health care provider. Document Revised: 06/06/2020 Document Reviewed: 06/06/2020 Elsevier Patient Education  2021 Elsevier Inc.   Important Information About Sugar

## 2023-07-23 NOTE — Progress Notes (Signed)
Cardiology Office Note:    Date:  07/23/2023   ID:  MILLISSA MURNANE, DOB 06-02-1981, MRN [id number]  PCP:  Eather Colas, FNP  Cardiologist:  Garwin Brothers, MD   Referring MD: Lavinia Sharps, NP    ASSESSMENT:    1. Chest pain, unspecified type   2. Cardiac murmur   3. Snoring   4. Obesity (BMI 30.0-34.9)   5. Xanthelasma of eyelids of both eyes   6. Palpitations    PLAN:    In order of problems listed above:  Primary prevention stressed with the patient.  Importance of compliance with diet medication stressed and patient verbalized standing. Chest pain: Atypical in nature.  Pretty much aggravated by cough which is post COVID.  She has been seeing pulmonary for this. Snoring: Patient tells me that her partner mentions to her that she has significant snoring and she has gasping spells at times.  Will set her up for a sleep study. Cardiac murmur: Echocardiogram will be done to assess murmur heard on auscultation. Blackout spells: She occasionally has palpitations.  Will do a 2-week monitor to assess this.  Her primary care and neurology is pursuing this.  She told me that she is aware that she has been advised not to drive by her specialists and providers.  I reemphasized this. I will get her in the next few days for blood work including lipids.  She appears to have xanthelasmas on her eyelids. Patient will be seen in follow-up appointment in 6 months or earlier if the patient has any concerns.    Medication Adjustments/Labs and Tests Ordered: Current medicines are reviewed at length with the patient today.  Concerns regarding medicines are outlined above.  No orders of the defined types were placed in this encounter.  No orders of the defined types were placed in this encounter.    History of Present Illness:    NEMIAH LEDEZMA is a 42 y.o. female who is being seen today for the evaluation of blackout spells at the request of Placey, Chales Abrahams, NP.  Patient is a  pleasant 42 year old female.  She has past medical history that is not much significant.  Patient mentions to me that she has been having blackout spells.  She stands in the place where she is standing.  She has a blackout spell.  She does not pass out.  No chest pain orthopnea or PND.  She has had COVID and since then she has had a lot of coughing with COVID she has chest pain.  At the time of my evaluation, the patient is alert awake oriented and in no distress.  Past Medical History:  Diagnosis Date   ADD (attention deficit disorder) 07/22/2011   ADHD (attention deficit hyperactivity disorder)    Allergic rhinitis 01/23/2011   Formatting of this note might be different from the original. Allergic Rhinitis 10/1 IMO update     Anxiety 10/03/2015   Last Assessment & Plan: Formatting of this note might be different from the original. Increased/worsening anxiety and depression.  Will restart patient on Trintellix as she reports this was a good regimen for her.  Started 10 mg.  Patient was psychiatry follow-up in 4 weeks.  We will follow-up pending evaluation.  Patient about to lose her Medicaid for her reports.  We will follow-up with family s   Asthma    Candidiasis of vagina 01/20/2023   Chest pain 05/20/2012   Depression    Fatigue 10/03/2015  Last Assessment & Plan: Formatting of this note might be different from the original. Patient with increased fatigue.  Chronic problem.  Reports history of iron deficiency anemia.  Concurrent depression.  Will treat patient for depression as well as draw basic labs for further evaluation.  Follow-up pending labs.     Genital herpes 10/16/2009   Formatting of this note might be different from the original. Herpes Simplex Type II 10/1 IMO update Last Assessment & Plan: Formatting of this note might be different from the original. Patient with past positivity for HSV 1 and 2 serotype. Notes some genital lesions in the past as well. Patient denies any recent  symptoms or need for valtrex. Patient is concerned as she is in a new relationship a   Herpes simplex 01/20/2023   Herpesvirus infection 01/20/2023   History of trichomonal vaginitis 12/24/2016   Last Assessment & Plan: Formatting of this note might be different from the original. History of trichomonas- dx in clinic on 12/17/16 by Dr. Earnest Bailey. Completed treatment with Flagyl 12/20/16. Pt requesting repeat testing today. Per patient, partner was treated for trichomonas after testing positive. Recheck today to assure no reinfection. Pt verbalizes understanding for current treatment plan.     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 Depression     Postcoital bleeding 01/20/2023   Sexually transmitted disease 01/20/2023   Vaginal discharge 01/20/2023   Vitamin D deficiency 10/15/2010   Formatting of this note might be different from the original. Vitamin D Deficiency 10/1 IMO update      Past Surgical History:  Procedure Laterality Date   CYSTECTOMY     Neck   Pilonidal cyst removal     Tubal implant procedure      Current Medications: Current Meds  Medication Sig   benzonatate (TESSALON) 100 MG capsule Take 200 mg by mouth 3 (three) times daily as needed for cough.   cetirizine (ZYRTEC ALLERGY) 10 MG tablet Take 1 tablet (10 mg total) by mouth daily.   fluticasone (FLONASE) 50 MCG/ACT nasal spray Place 1 spray into both nostrils daily.   fluticasone (FLOVENT HFA) 110 MCG/ACT inhaler Inhale 1 puff into the lungs at bedtime.   predniSONE (DELTASONE) 10 MG tablet Take 3 tablets (30 mg total) by mouth daily with breakfast.   promethazine-codeine (PHENERGAN WITH CODEINE) 6.25-10 MG/5ML syrup Take 5 mLs by mouth 3 (three) times daily as needed for cough.     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  Tobacco Use   Smoking status: Former    Current packs/day: 0.00    Average packs/day: 0.2 packs/day for 10.0 years (2.0 ttl pk-yrs)    Types: Cigarettes    Start date: 2013    Quit date: 2023    Years since quitting: 1.7   Smokeless tobacco: Never  Vaping Use   Vaping status: Never Used  Substance and Sexual Activity   Alcohol use: Yes    Comment: socially, not even one drink a week   Drug use: No  Sexual activity: Yes    Birth control/protection: Other-see comments  Other Topics Concern   Not on file  Social History Narrative   Caffeine: none   Social Determinants of Health   Financial Resource Strain: Not on file  Food Insecurity: No Food Insecurity (08/20/2022)   Hunger Vital Sign    Worried About Running Out of Food in the Last Year: Never true    Ran Out of Food in the Last Year: Never true  Transportation Needs: No Transportation Needs (08/20/2022)   PRAPARE - Administrator, Civil Service (Medical): No    Lack of Transportation (Non-Medical): No  Physical Activity: Not on file  Stress: Not on file  Social Connections: Unknown (07/11/2023)   [facility]   Social Network    Social Network: Not on file     Family History: The patient's family history includes Breast cancer in her maternal grandmother; COPD in her maternal grandfather, maternal grandmother, and mother; Glaucoma in her father; Heart attack in her maternal grandmother; Heart disease in her maternal grandfather; High Cholesterol in her father; Hypertension in her mother.  ROS:   Please see the history of present illness.    All other systems reviewed and are negative.  EKGs/Labs/Other  Studies Reviewed:    The following studies were reviewed today: EKG reveals sinus rhythm and nonspecific ST changes and poor anterior forces       Recent Labs: 06/26/2023: BUN 16; Creatinine, Ser 0.80; Hemoglobin 12.1; Magnesium 2.4; Platelets 382; Potassium 4.1; Sodium 140  Recent Lipid Panel No results found for: "CHOL", "TRIG", "HDL", "CHOLHDL", "VLDL", "LDLCALC", "LDLDIRECT"  Physical Exam:    VS:  BP 116/74   Pulse 88   Ht 5\' 7"  (1.702 m)   Wt 199 lb 0.6 oz (90.3 kg)   LMP 06/19/2023 (Approximate)   SpO2 97%   BMI 31.17 kg/m     Wt Readings from Last 3 Encounters:  07/23/23 199 lb 0.6 oz (90.3 kg)  07/17/23 193 lb (87.5 kg)  06/26/23 180 lb (81.6 kg)     GEN: Patient is in no acute distress HEENT: Normal NECK: No JVD; No carotid bruits LYMPHATICS: No lymphadenopathy CARDIAC: S1 S2 regular, 2/6 systolic murmur at the apex. RESPIRATORY:  Clear to auscultation without rales, wheezing or rhonchi  ABDOMEN: Soft, non-tender, non-distended MUSCULOSKELETAL:  No edema; No deformity  SKIN: Warm and dry NEUROLOGIC:  Alert and oriented x 3 PSYCHIATRIC:  Normal affect    Signed, Garwin Brothers, MD  07/23/2023 9:15 AM    [facility]

## 2023-07-25 ENCOUNTER — Telehealth: Payer: Self-pay

## 2023-07-25 LAB — LIPID PANEL
Chol/HDL Ratio: 2.9 {ratio} (ref 0.0–4.4)
Cholesterol, Total: 158 mg/dL (ref 100–199)
HDL: 55 mg/dL (ref >39)
LDL Chol Calc (NIH): 86 mg/dL (ref 0–99)
Triglycerides: 89 mg/dL (ref 0–149)
VLDL Cholesterol Cal: 17 mg/dL (ref 5–40)

## 2023-07-25 LAB — COMPREHENSIVE METABOLIC PANEL
ALT: 10 [IU]/L (ref 0–32)
AST: 12 [IU]/L (ref 0–40)
Albumin: 3.9 g/dL (ref 3.9–4.9)
Alkaline Phosphatase: 37 [IU]/L — ABNORMAL LOW (ref 44–121)
BUN/Creatinine Ratio: 19 (ref 9–23)
BUN: 17 mg/dL (ref 6–24)
Bilirubin Total: 0.3 mg/dL (ref 0.0–1.2)
CO2: 27 mmol/L (ref 20–29)
Calcium: 9.7 mg/dL (ref 8.7–10.2)
Chloride: 104 mmol/L (ref 96–106)
Creatinine, Ser: 0.91 mg/dL (ref 0.57–1.00)
Globulin, Total: 2.6 g/dL (ref 1.5–4.5)
Glucose: 91 mg/dL (ref 70–99)
Potassium: 4.8 mmol/L (ref 3.5–5.2)
Sodium: 143 mmol/L (ref 134–144)
Total Protein: 6.5 g/dL (ref 6.0–8.5)
eGFR: 81 mL/min/{1.73_m2} (ref >59)

## 2023-07-25 LAB — TSH: TSH: 0.922 u[IU]/mL (ref 0.450–4.500)

## 2023-07-25 NOTE — Telephone Encounter (Signed)
Received FMLA paperwork via fax. Advised to discuss with PCP as we will not be completing. Pt verbalized understanding and had no additional questions.

## 2023-07-29 ENCOUNTER — Telehealth: Payer: Self-pay

## 2023-07-29 NOTE — Telephone Encounter (Signed)
**Note De-identified Ilyana Manuele Obfuscation** -----  **Note De-Identified Edan Serratore Obfuscation** Message from Nurse Ladonna Snide F sent at 07/23/2023  9:18 AM EDT ----- Regarding: itamar Pt needs an itamar. Thank you Ladonna Snide

## 2023-07-29 NOTE — Telephone Encounter (Signed)
**Note De-Identified Natalie Colon Obfuscation** Ordering provider: Dr Henrietta Hoover Associated diagnoses: Snoring-R06.83  WatchPAT PA obtained on 07/29/2023 by Krysia Zahradnik, Lorelle Formosa, LPN- No PA Requied per [place] MEDICAID [employer] [place] for CPT Code: [address]109 Patient will be provided with a WatchPAT One-HST device and the PIN (1234) by Dr Leander Rams nurse once I forward this note to her  making her aware that no PA is required.  Phone note routed to covering staff for follow-up.  Instructions for covering staff:  Please contact patient in 2 weeks if WatchPAT study results are not available yet. Remind patient to complete test.  If patient declines to proceed with test, please confirm that box is unopened and remind patient to return it to the office within 30 days. Route phone note to [facility] SLEEP STUDIES pool for tracking.  If box has been opened, please route phone note to Erskine Squibb (billing department).

## 2023-08-01 ENCOUNTER — Encounter: Payer: Self-pay | Admitting: Neurology

## 2023-08-08 ENCOUNTER — Ambulatory Visit
Admission: RE | Admit: 2023-08-08 | Discharge: 2023-08-08 | Disposition: A | Discharge status: Home / Self Care | Payer: Medicaid Other | Source: Ambulatory Visit | Attending: Neurology | Admitting: Neurology

## 2023-08-08 DIAGNOSIS — M5412 Radiculopathy, cervical region: Secondary | ICD-10-CM

## 2023-08-08 DIAGNOSIS — M79602 Pain in left arm: Secondary | ICD-10-CM | POA: Diagnosis not present

## 2023-08-08 DIAGNOSIS — G8929 Other chronic pain: Secondary | ICD-10-CM

## 2023-08-08 DIAGNOSIS — M79601 Pain in right arm: Secondary | ICD-10-CM

## 2023-08-11 NOTE — Telephone Encounter (Signed)
Patient agreement reviewed and signed on 08/11/2023.  WatchPAT issued to patient on 08/11/2023 by Brunetta Genera, CMA. Patient aware to not open the WatchPAT box until contacted with the activation PIN. Patient profile initialized in CloudPAT on 08/11/2023 by Ashley Mariner, CMA. Device serial number: [id number]

## 2023-08-12 ENCOUNTER — Encounter (INDEPENDENT_AMBULATORY_CARE_PROVIDER_SITE_OTHER): Payer: Medicaid Other | Admitting: Cardiology

## 2023-08-12 DIAGNOSIS — G4710 Hypersomnia, unspecified: Secondary | ICD-10-CM | POA: Diagnosis not present

## 2023-08-12 DIAGNOSIS — G4719 Other hypersomnia: Secondary | ICD-10-CM

## 2023-08-13 ENCOUNTER — Other Ambulatory Visit: Payer: Medicaid Other

## 2023-08-14 ENCOUNTER — Ambulatory Visit: Payer: Medicaid Other | Admitting: Neurology

## 2023-08-14 DIAGNOSIS — R55 Syncope and collapse: Secondary | ICD-10-CM

## 2023-08-14 DIAGNOSIS — R42 Dizziness and giddiness: Secondary | ICD-10-CM

## 2023-08-14 DIAGNOSIS — H547 Unspecified visual loss: Secondary | ICD-10-CM

## 2023-08-14 DIAGNOSIS — R519 Headache, unspecified: Secondary | ICD-10-CM

## 2023-08-14 NOTE — Procedures (Signed)
   History:  42 year old woman with syncope   EEG classification:  Awake and asleep  Duration: 25 minutes   Technical aspects: This EEG study was done with scalp electrodes positioned according to the 10-20 International system of electrode placement. Electrical activity was reviewed with band pass filter of 1-70Hz , sensitivity of 7 uV/mm, display speed of 51mm/sec with a 60Hz  notched filter applied as appropriate. EEG data were recorded continuously and digitally stored.   Description of the recording: The background rhythms of this recording consists of a fairly well modulated medium amplitude background activity of 9 Hz. As the record progresses, the patient initially is in the waking state, but appears to enter the early stage II sleep during the recording, with rudimentary sleep spindles and vertex sharp wave activity seen. During the wakeful state, photic stimulation was performed, and no abnormal responses were seen. Hyperventilation was also performed, no abnormal response seen. There were presence of high amplitude theta activity with embedded sharp and slow wave discharges. There was also some generalized sharp and slow wave discharges seen during this recording. There was no focal slowing.   Abnormality: Generalized sharp and slow wave discharges   Impression: This is an abnormal EEG recording in the waking and sleeping state due to presence of generalized epileptiform discharges. 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    Windell Norfolk, MD [facility]

## 2023-08-15 ENCOUNTER — Other Ambulatory Visit: Payer: Medicaid Other

## 2023-08-15 ENCOUNTER — Telehealth: Payer: Self-pay | Admitting: Cardiovascular Disease

## 2023-08-15 NOTE — Telephone Encounter (Signed)
Spoke with patient and she was calling to get results from her sleep study and heart monitor. Those results does not seem to be finalized. She would also like for you to review EEG.

## 2023-08-15 NOTE — Telephone Encounter (Signed)
Patient calling to get results from all of her test. Please advise

## 2023-08-18 ENCOUNTER — Telehealth: Payer: Self-pay | Admitting: Neurology

## 2023-08-18 NOTE — Telephone Encounter (Signed)
Please discuss with patient, her EEG was abnormal. It did show the potential for seizures. Given your symptoms, I would like to treat you with an antiepilepsy medication. I will have my team give you a call. I recommend Keppra 500mg  twice daily. And by  law you cannot drive until episode free for 6 months. Please ensure you have a follow up with me in 3-4 months and if you have another episode while on the keppra please call us. Pod 4 I will put this in a telephone note for you to call, also discuss: Per [facility] statutes, patients with seizures are not allowed to drive until they have been seizure-free for six months. The most common side effects of Keppra are (these are possible but may not happen):  fatigue dizziness  sleepiness*. If she agrees start Keppra 500mg  bid and she can follow up with me in 4 months or sooner if needed.  You can discuss below or tell patient you will send a mychart message thanks:   Use caution when using heavy equipment or power tools. Avoid working on ladders or at heights. Take showers instead of baths. Ensure the water temperature is not too high on the home water heater. Do not go swimming alone. Do not lock yourself in a room alone (i.e. bathroom). When caring for infants or small children, sit down when holding, feeding, or changing them to minimize risk of injury to the child in the event you have a seizure. Maintain good sleep hygiene. Avoid alcohol.    If patient has another seizure, call 911 and bring them back to the ED  Per [facility] statutes, patients with seizures are not allowed to drive until they have been seizure-free for six months.    - First, ensure adequate ventilation and place patients on the floor on their left side  Loosen clothing around the neck and ensure the airway is patent. If the patient is clenching the teeth, do not force the mouth open with any object as this can cause severe damage - Remove all items from the  surrounding that can be hazardous. The patient may be oblivious to what's happening and may not even know what he or she is doing. If the patient is confused and wandering, either gently guide him/her away and block access to outside areas - Reassure the individual and be comforting - Call 911. In most cases, the seizure ends before EMS arrives. However, there are cases when seizures may last over 3 to 5 minutes. Or the individual may have developed breathing difficulties or severe injuries. If a pregnant patient or a person with diabetes develops a seizure, it is prudent to call an ambulance.  Most seizures are painless and end spontaneously. Seizures are not harmful to others but can lead to complications such as stress on the lungs, brain and the heart. Individuals with prior lung problems may develop labored breathing and respiratory distress.

## 2023-08-19 ENCOUNTER — Ambulatory Visit (HOSPITAL_BASED_OUTPATIENT_CLINIC_OR_DEPARTMENT_OTHER)
Admission: RE | Admit: 2023-08-19 | Discharge: 2023-08-19 | Disposition: A | Discharge status: Home / Self Care | Payer: Medicaid Other | Source: Ambulatory Visit | Attending: Cardiology | Admitting: Cardiology

## 2023-08-19 DIAGNOSIS — R011 Cardiac murmur, unspecified: Secondary | ICD-10-CM | POA: Insufficient documentation

## 2023-08-19 NOTE — Telephone Encounter (Signed)
I added albuterol to her med list.

## 2023-08-19 NOTE — Telephone Encounter (Signed)
Pt viewed results on MyChart. Want to let Dr. Lucia Gaskins that already take Albuterol, research that Keppra counteracts with Albuterol. Would like a call from the nurse to discuss.

## 2023-08-19 NOTE — Addendum Note (Signed)
Addended by: Guy Begin on: 08/19/2023 11:44 AM   Modules accepted: Orders

## 2023-08-20 LAB — ECHOCARDIOGRAM COMPLETE
AR max vel: 2.23 cm2
AV Area VTI: 2.07 cm2
AV Area mean vel: 2.23 cm2
AV Mean grad: 3 mm[Hg]
AV Peak grad: 5 mm[Hg]
Ao pk vel: 1.12 m/s
Area-P 1/2: 2.48 cm2
Calc EF: 60.3 %
MV M vel: 4.43 m/s
MV Peak grad: 78.3 mm[Hg]
S' Lateral: 3.1 cm
Single Plane A2C EF: 57.3 %
Single Plane A4C EF: 60.5 %

## 2023-08-21 ENCOUNTER — Encounter: Payer: Self-pay | Admitting: Pulmonary Disease

## 2023-08-21 ENCOUNTER — Ambulatory Visit: Payer: Medicaid Other | Admitting: Pulmonary Disease

## 2023-08-21 VITALS — BP 118/77 | HR 71 | Ht 67.0 in | Wt 195.0 lb

## 2023-08-21 DIAGNOSIS — J45909 Unspecified asthma, uncomplicated: Secondary | ICD-10-CM | POA: Diagnosis not present

## 2023-08-21 MED ORDER — PREDNISONE 20 MG PO TABS: 20.0000 mg | ORAL_TABLET | Freq: Every day | ORAL | 0 refills | Status: DC

## 2023-08-21 MED ORDER — FLUTICASONE PROPIONATE HFA 220 MCG/ACT IN AERO: 2.0000 | INHALATION_SPRAY | Freq: Two times a day (BID) | RESPIRATORY_TRACT | 5 refills | Status: AC

## 2023-08-21 MED ORDER — BENZONATATE 200 MG PO CAPS: 200.0000 mg | ORAL_CAPSULE | Freq: Three times a day (TID) | ORAL | 1 refills | Status: DC | PRN

## 2023-08-21 MED ORDER — VENTOLIN HFA 108 (90 BASE) MCG/ACT IN AERS: 1.0000 | INHALATION_SPRAY | RESPIRATORY_TRACT | 2 refills | Status: AC | PRN

## 2023-08-21 NOTE — Telephone Encounter (Signed)
Dr. Teresa Coombs, can I get your opinion on starting keppra when patient is on an inhaler? See below. If you do not recommend keppra, what else would you recommend? You read her eeg, see reults. Thank you SO much!

## 2023-08-21 NOTE — Patient Instructions (Signed)
I will see you in about 3 months  Prescription of benzonatate -You can take this for symptomatic relief of a cough  Prescription for prednisone -This is to help inflammation in the airway -20 mg daily for 5 to 7 days  Prescription for Flovent to be used 2 puffs twice a day  Prescription for albuterol can be used as needed up to 4 times a day as needed

## 2023-08-21 NOTE — Telephone Encounter (Signed)
Once Dr. Mayford Knife reviews the results she will place a note in the chart and her staff calls the pt. I sent it to you for the Zio report.

## 2023-08-21 NOTE — Telephone Encounter (Signed)
Thank you Dr. Teresa Coombs!  Pod4 please let patient know thank you

## 2023-08-21 NOTE — Progress Notes (Signed)
Natalie Colon    [id number]    [birth year]  Primary Care Physician:[facility]  Referring Physician: [facility] [address] [place],  [place] [address]  Chief complaint:   Asthma with uncontrolled symptoms  HPI:  History of childhood asthma  Recently had COVID which made asthma symptoms uncontrolled  Coughing, shortness of breath, wheezing  Was not on any inhalers prior has asthma as remains usually well-controlled  Has had COVID 4 times  Persistent cough and shortness of breath despite use of inhalers  Outpatient Encounter Medications as of 08/21/2023  Medication Sig   cetirizine (ZYRTEC ALLERGY) 10 MG tablet Take 1 tablet (10 mg total) by mouth daily.   fluticasone (FLONASE) 50 MCG/ACT nasal spray Place 1 spray into both nostrils daily.   fluticasone (FLOVENT HFA) 110 MCG/ACT inhaler Inhale 1 puff into the lungs at bedtime.   VENTOLIN HFA 108 (90 Base) MCG/ACT inhaler Inhale 1-2 puffs into the lungs every 4 (four) hours as needed.   [DISCONTINUED] benzonatate (TESSALON) 100 MG capsule Take 200 mg by mouth 3 (three) times daily as needed for cough.   [DISCONTINUED] predniSONE (DELTASONE) 10 MG tablet Take 3 tablets (30 mg total) by mouth daily with breakfast.   [DISCONTINUED] promethazine-codeine (PHENERGAN WITH CODEINE) 6.25-10 MG/5ML syrup Take 5 mLs by mouth 3 (three) times daily as needed for cough.   No facility-administered encounter medications on file as of 08/21/2023.    Allergies as of 08/21/2023 - Review Complete 08/21/2023  Allergen Reaction Noted   Latex Itching 12/08/2011   Penicillins Other (See Comments) 11/27/2011   Vicodin [hydrocodone-acetaminophen] Other (See Comments) 11/27/2011   Hydrocodone-acetaminophen Palpitations 06/23/2014    Past Medical History:  Diagnosis Date   ADD (attention deficit disorder) 07/22/2011   ADHD (attention deficit hyperactivity disorder)    Allergic rhinitis  01/23/2011   Formatting of this note might be different from the original. Allergic Rhinitis 10/1 IMO update     Anxiety 10/03/2015   Last Assessment & Plan: Formatting of this note might be different from the original. Increased/worsening anxiety and depression.  Will restart patient on Trintellix as she reports this was a good regimen for her.  Started 10 mg.  Patient was psychiatry follow-up in 4 weeks.  We will follow-up pending evaluation.  Patient about to lose her Medicaid for her reports.  We will follow-up with family s   Asthma    Candidiasis of vagina 01/20/2023   Chest pain 05/20/2012   Depression    Fatigue 10/03/2015   Last Assessment & Plan: Formatting of this note might be different from the original. Patient with increased fatigue.  Chronic problem.  Reports history of iron deficiency anemia.  Concurrent depression.  Will treat patient for depression as well as draw basic labs for further evaluation.  Follow-up pending labs.     Genital herpes 10/16/2009   Formatting of this note might be different from the original. Herpes Simplex Type II 10/1 IMO update Last Assessment & Plan: Formatting of this note might be different from the original. Patient with past positivity for HSV 1 and 2 serotype. Notes some genital lesions in the past as well. Patient denies any recent symptoms or need for valtrex. Patient is concerned as she is in a new relationship a   Herpes simplex 01/20/2023   Herpesvirus infection 01/20/2023   History of trichomonal vaginitis 12/24/2016   Last Assessment & Plan: Formatting of this note might  be different from the original. History of trichomonas- dx in clinic on 12/17/16 by Dr. Earnest Bailey. Completed treatment with Flagyl 12/20/16. Pt requesting repeat testing today. Per patient, partner was treated for trichomonas after testing positive. Recheck today to assure no reinfection. Pt verbalizes understanding for current treatment plan.     Mitral valve prolapse 10/28/2008    Echo and workup in [place]   Moderate episode of recurrent major depressive disorder (HCC) 02/27/2011   Formatting of this note might be different from the original. Depression     Postcoital bleeding 01/20/2023   Sexually transmitted disease 01/20/2023   Vaginal discharge 01/20/2023   Vitamin D deficiency 10/15/2010   Formatting of this note might be different from the original. Vitamin D Deficiency 10/1 IMO update      Past Surgical History:  Procedure Laterality Date   CYSTECTOMY     Neck   Pilonidal cyst removal     Tubal implant procedure      Family History  Problem Relation Age of Onset   COPD Mother    Hypertension Mother    Glaucoma Father    High Cholesterol Father    Breast cancer Maternal Grandmother    Heart attack Maternal Grandmother    COPD Maternal Grandmother    Heart disease Maternal Grandfather    COPD Maternal Grandfather     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  Tobacco Use   Smoking status: Former    Current packs/day: 0.00    Average packs/day: 0.2 packs/day for 10.0 years (2.0 ttl pk-yrs)    Types: Cigarettes    Start date: 2013    Quit date: 2023    Years since quitting: 1.8   Smokeless tobacco: Never  Vaping Use   Vaping status: Never Used  Substance and Sexual Activity   Alcohol use: Yes    Comment: socially, not even one drink a week   Drug use: No   Sexual activity: Yes    Birth control/protection: Other-see comments  Other Topics Concern   Not on file  Social History Narrative   Caffeine: none   Social Determinants of Health   Financial Resource Strain: Low Risk  (07/22/2023)   Received from [facility]   Overall Financial Resource Strain (CARDIA)    Difficulty of Paying Living Expenses: Not hard at  all  Food Insecurity: No Food Insecurity (07/22/2023)   Received from [facility]   Hunger Vital Sign    Worried About Running Out of Food in the Last Year: Never true    Ran Out of Food in the Last Year: Never true  Transportation Needs: Unmet Transportation Needs (07/22/2023)   Received from [facility] - Transportation    Lack of Transportation (Medical): No    Lack of Transportation (Non-Medical): Yes  Physical Activity: Unknown (07/22/2023)   Received from [facility]   Exercise Vital Sign    Days of Exercise per Week: Patient declined    Minutes of Exercise per Session: Not on file  Stress: No Stress Concern Present (07/22/2023)   Received from [facility] - Occupational Stress Questionnaire    Feeling of Stress : Not at all  Social Connections:  Moderately Integrated (07/22/2023)   Received from [facility]   Social Network    How would you rate your social network (family, work, friends)?: Adequate participation with social networks  Intimate Partner Violence: Not At Risk (07/22/2023)   Received from [facility]   HITS    Over the last 12 months how often did your partner physically hurt you?: 1    Over the last 12 months how often did your partner insult you or talk down to you?: 1    Over the last 12 months how often did your partner threaten you with physical harm?: 1    Over the last 12 months how often did your partner scream or curse at you?: 1    Review of Systems  Respiratory:  Positive for shortness of breath and wheezing.     Vitals:   08/21/23 1327  BP: 118/77  Pulse: 71  SpO2: 97%     Physical Exam Constitutional:      Appearance: She is obese.  HENT:     Head: Normocephalic.     Mouth/Throat:     Mouth: Mucous membranes are moist.  Eyes:     General: No scleral icterus. Cardiovascular:     Rate and Rhythm: Normal rate and regular rhythm.     Heart sounds: No murmur heard.    No  friction rub.  Pulmonary:     Effort: No respiratory distress.     Breath sounds: No stridor. No wheezing or rhonchi.  Musculoskeletal:     Cervical back: No rigidity or tenderness.  Neurological:     Mental Status: She is alert.  Psychiatric:        Mood and Affect: Mood normal.    Data Reviewed: No recent testing  Assessment:  Asthma with uncontrolled symptoms  Recent COVID infection made asthma uncontrolled  History of major depression  Plan/Recommendations:  Prescription for benzonatate  Prescription for prednisone 20 mg to be used for 5 to 7 days  Prescription for Flovent 220 -2 puffs twice a day  Albuterol use as needed  Tentative follow-up in about 3 months  Encouraged to call with significant concerns   Virl Diamond MD  Pulmonary and Critical Care 08/21/2023, 1:35 PM  CC: [facility]

## 2023-08-21 NOTE — Telephone Encounter (Signed)
I am not aware of any interaction between Ventolin inhaler and Keppra and I would not adjust the dose.  AC

## 2023-08-24 ENCOUNTER — Ambulatory Visit: Payer: Medicaid Other | Attending: Cardiology

## 2023-08-24 DIAGNOSIS — R0683 Snoring: Secondary | ICD-10-CM

## 2023-08-24 NOTE — Procedures (Signed)
    SLEEP STUDY REPORT Patient Information Study Date: 08/12/2023 Patient Name: Natalie Colon Patient ID: [id number] Birth Date: 04-13-1981 Age: 42 Gender: Female BMI: 31.1 (W=198 lb, H=5' 7'') Referring Physician: Belva Crome, MD  TEST DESCRIPTION: Home sleep apnea testing was completed using the WatchPat, a Type 1 device, utilizing peripheral arterial tonometry (PAT), chest movement, actigraphy, pulse oximetry, pulse rate, body position and snore. AHI was calculated with apnea and hypopnea using valid sleep time as the denominator. RDI includes apneas, hypopneas, and RERAs. The data acquired and the scoring of sleep and all associated events were performed in accordance with the recommended standards and specifications as outlined in the AASM Manual for the Scoring of Sleep and Associated Events 2.2.0 (2015).  FINDINGS: 1. No evidence of Obstructive Sleep Apnea with AHI 0.7/hr. 2. No Central Sleep Apnea. 3. Oxygen desaturations as low as 78%. 4. Mild to moderate snoring was present. O2 sats were < 88% for 0.1 minutes. 5. Total sleep time was 8 hrs and 6 min. 6. 20.8% of total sleep time was spent in REM sleep. 7. Normal sleep onset latency at 23 min. 8. Prolonged REM sleep onset latency at 176 min. 9. Total awakenings were 8.  DIAGNOSIS: Normal study with no significant sleep disordered breathing.  RECOMMENDATIONS: 1. Normal study with no significant sleep disordered breathing. 2. Healthy sleep recommendations include: adequate nightly sleep (normal 7-9 hrs/night), avoidance of caffeine after noon and alcohol near bedtime, and maintaining a sleep environment that is cool, dark and quiet. 3. Weight loss for overweight patients is recommended. 4. Snoring recommendations include: weight loss where appropriate, side sleeping, and avoidance of alcohol before bed. 5. Operation of motor vehicle or dangerous equipment must be avoided when feeling drowsy, excessively sleepy,  or mentally fatigued. 6. An ENT consultation which may be useful for specific causes of and possible treatment of bothersome snoring . 7. Weight loss may be of benefit in reducing the severity of snoring.   Signature: Armanda Magic, MD; [facility]; Diplomat, American Board of Sleep Medicine Electronically Signed: 08/24/2023 8:59:19 PM

## 2023-08-25 ENCOUNTER — Other Ambulatory Visit: Payer: Self-pay | Admitting: *Deleted

## 2023-08-25 MED ORDER — LEVETIRACETAM 500 MG PO TABS: 500.0000 mg | ORAL_TABLET | Freq: Two times a day (BID) | ORAL | 2 refills | Status: DC

## 2023-08-25 NOTE — Telephone Encounter (Signed)
Please call patient, her EEG was abnormal showed epileptiform discharge  She should start taking medicine to prevent seizure, passing out episode  Keppra was excreted from body mainly by kidney, no significant liver metabolism,  No contraindication for her to start Keppra, no interaction with albuterol documented

## 2023-08-25 NOTE — Telephone Encounter (Signed)
I called pt and relayed results of EEG, potential for seizures.  She asked what called specifically , I would have to check on that for her.  She has no appt at this time.  Needs follow up to go over testing.  She had a lot of questions for her cardiology tests and I deferred her to them.  She continues with pain in arm, not sure what that is. Went over her mri cervical (degenerative changes appropriate for age.

## 2023-08-25 NOTE — Telephone Encounter (Signed)
Levitiracetam 500mg  po bid ordered to pts pharmacy.  Relayed to read side effects and speak to pharmacy relating to this medications.  Look out for mood swings, suicidal ideations.  Pt was aware.

## 2023-08-26 ENCOUNTER — Telehealth: Payer: Self-pay

## 2023-08-26 DIAGNOSIS — I471 Supraventricular tachycardia, unspecified: Secondary | ICD-10-CM

## 2023-08-26 DIAGNOSIS — I4729 Other ventricular tachycardia: Secondary | ICD-10-CM

## 2023-08-26 NOTE — Telephone Encounter (Signed)
-----   Message from [place] R Revankar sent at 08/21/2023  2:00 PM EDT ----- Abnormal monitor with nonsustained ventricular tachycardia.  Medical management at this time.  Lets get a magnesium level and exercise stress echo.  Copy primary care.  Diagnosis for stress echo is dyspnea on exertion and nonsustained ventricular tachycardia.  Also on the echo notes please suggest to the echo tech that I would like to see what happens to the mitral regurgitation at the peak of exercise. Garwin Brothers, MD 08/21/2023 1:59 PM

## 2023-08-26 NOTE — Telephone Encounter (Signed)
Viewed in MyChart Routed to PCP  Your monitor showed abnormal monitor with nonsustained ventricular tachycardia.  Medical management at this time.  We will order the stress echo. Someone will call and get you scheduled.   Mild to moderate mitral regurgitation.  We will monitor this every year by repeating your echocardiogram.  Heart healthy diet and aerobic exercises either walking or stationary bicycle or such exercises.

## 2023-08-26 NOTE — Telephone Encounter (Signed)
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 She should be scheduled for a follow up visit since she was started on Keppra

## 2023-08-27 ENCOUNTER — Ambulatory Visit
Admission: RE | Admit: 2023-08-27 | Discharge: 2023-08-27 | Disposition: A | Discharge status: Home / Self Care | Payer: Medicaid Other | Source: Ambulatory Visit | Attending: Nurse Practitioner | Admitting: Nurse Practitioner

## 2023-08-27 DIAGNOSIS — Z1231 Encounter for screening mammogram for malignant neoplasm of breast: Secondary | ICD-10-CM

## 2023-08-27 LAB — MAGNESIUM: Magnesium: 2.2 mg/dL (ref 1.6–2.3)

## 2023-08-29 ENCOUNTER — Telehealth (HOSPITAL_COMMUNITY): Payer: Self-pay | Admitting: *Deleted

## 2023-08-29 ENCOUNTER — Encounter: Payer: Self-pay | Admitting: Pulmonary Disease

## 2023-08-29 NOTE — Telephone Encounter (Signed)
Patient given detailed instructions per Stress Test Requisition Sheet for test on 09/01/2023 at 2:30.Patient Notified to arrive 30 minutes early, and that it is imperative to arrive on time for appointment to keep from having the test rescheduled.  Patient verbalized understanding. Daneil Dolin

## 2023-09-01 ENCOUNTER — Ambulatory Visit (HOSPITAL_BASED_OUTPATIENT_CLINIC_OR_DEPARTMENT_OTHER): Payer: Medicaid Other

## 2023-09-01 ENCOUNTER — Ambulatory Visit (HOSPITAL_COMMUNITY): Payer: Medicaid Other | Attending: Cardiovascular Disease

## 2023-09-01 DIAGNOSIS — I4729 Other ventricular tachycardia: Secondary | ICD-10-CM

## 2023-09-01 DIAGNOSIS — I471 Supraventricular tachycardia, unspecified: Secondary | ICD-10-CM | POA: Diagnosis present

## 2023-09-02 ENCOUNTER — Telehealth: Payer: Self-pay | Admitting: Cardiology

## 2023-09-02 NOTE — Telephone Encounter (Signed)
Per 12/05 appointment notes patient is wanting to do a provider switch to Dr. Clifton James due to previously seeing him in the past and living in [place].  Are you both okay with this?

## 2023-09-03 NOTE — Telephone Encounter (Signed)
Dr. Teresa Coombs will you consult on this patient for me please and possibly see her for a follow up? Her EEG that you reviewed showed epileptiform activity, I started her on Keppra. Would you meet with her? Her presentation seemed unusual to me for seizures but the EEG was very telling. Also I would advise her not to drive per [place] law at this time at least until we can see her, would you agree? Thank you!

## 2023-09-04 ENCOUNTER — Encounter: Payer: Self-pay | Admitting: Cardiovascular Disease

## 2023-09-04 ENCOUNTER — Telehealth: Payer: Self-pay

## 2023-09-04 ENCOUNTER — Encounter: Payer: Self-pay | Admitting: Neurology

## 2023-09-04 ENCOUNTER — Encounter: Payer: Self-pay | Admitting: Cardiology

## 2023-09-04 NOTE — Telephone Encounter (Signed)
See additional phone note. 

## 2023-09-04 NOTE — Telephone Encounter (Signed)
Dr. Teresa Coombs, If you get an open spot or a cancelation can I put her in there earlier than 12/20? I am So sorry and so thankful for you!

## 2023-09-04 NOTE — Telephone Encounter (Signed)
I am seeing her Monday.

## 2023-09-04 NOTE — Telephone Encounter (Signed)
  Pt is calling and requesting to speak with RN Ladonna Snide regarding her echo result

## 2023-09-04 NOTE — Telephone Encounter (Signed)
Spoke with pt and answered all of her questions. Pt verbalized understanding and had no additional questions.

## 2023-09-04 NOTE — Telephone Encounter (Signed)
I will add her to my schedule but so you know, she is also scheduled with [facility] 11/12/23

## 2023-09-04 NOTE — Telephone Encounter (Signed)
I called pt to check on her.  She has appointment scheduled for second opionion 10-17-2023 with Dr. Teresa Coombs.  Pt has not started taking her medication as she has multiple questions.  What type seizures, what are her seizures? Presenting like?  She is looking on internet , family, other family member who is on same drug and that persons dose.  I relayed that I cannot address another persons treatment.  I have explained to her that she may be having seizures and presented with syncope, not sure of the L arm pain. She has generalized epilepsy , discharges in EEG and due to this,  recommend taking keppra 500mg  po bid to prevent seizures.  She wants an appt even virtual to discuss results.  She will not take medication until she has appt due to questions.  She asked about why seeing Dr. Teresa Coombs, I relayed he is our seizure expert on site, and Dr. Lucia Gaskins asked if he would consult.  I did not see an availability at this time for sooner appt.

## 2023-09-08 ENCOUNTER — Ambulatory Visit: Payer: Medicaid Other | Admitting: Cardiology

## 2023-09-08 ENCOUNTER — Encounter: Payer: Self-pay | Admitting: Neurology

## 2023-09-08 ENCOUNTER — Ambulatory Visit: Payer: Medicaid Other | Admitting: Neurology

## 2023-09-08 VITALS — BP 126/84 | Resp 16 | Ht 67.0 in | Wt 201.5 lb

## 2023-09-08 DIAGNOSIS — F419 Anxiety disorder, unspecified: Secondary | ICD-10-CM

## 2023-09-08 DIAGNOSIS — R569 Unspecified convulsions: Secondary | ICD-10-CM | POA: Diagnosis not present

## 2023-09-08 MED ORDER — CITALOPRAM HYDROBROMIDE 20 MG PO TABS: 20.0000 mg | ORAL_TABLET | Freq: Every day | ORAL | 6 refills | Status: DC

## 2023-09-08 NOTE — Patient Instructions (Addendum)
Discontinue Keppra  Start Celexa 20 mg daily  Continue your other medications  Continue with Therapy  Strongly advise setting up care with psychiatry  Return as needed   -SUSPECTED SEIZURE DISORDER: You have experienced blackout episodes and arm pain, but your symptoms do not align with typical seizures. Therefore, we will not start anti-seizure medication like Keppra at this time. We may consider extended EEG monitoring if future symptoms warrant it.  -ANXIETY: You are experiencing stress, palpitations, and vision changes. We will start you on Celexa (citalopram) 10 mg daily for the first week, then increase to 20mg  daily. Additionally, we will recommend that you set up care with Psychiatry for further management and medication adjustment.  INSTRUCTIONS:  Please follow up with Psychiatry for further management of your anxiety and medication adjustment. Continue your current cardiac management as directed by Cardiology. If your symptoms persist or worsen, please contact our office for further evaluation.

## 2023-09-08 NOTE — Progress Notes (Signed)
[facility]  PATIENT: Natalie Colon DOB: [birth year]  REQUESTING CLINICIAN: [facility], I* HISTORY FROM: Patient and partner. Both parents via Facetime video call  REASON FOR VISIT: Abnormal EEG, concerns for seizure   HISTORICAL  CHIEF COMPLAINT:  Chief Complaint  Patient presents with   Consult    Rm12, female present, Second opinion for sz: pt stated that she had blackouts, pressure in head, loss of vision, bilateral arm pain. Pt stated that she has had testing done.     HISTORY OF PRESENT ILLNESS:  Discussed the use of AI scribe software for clinical note transcription with the patient, who gave verbal consent to proceed.  This is a 42 year old woman with PMHx including Anxiety/Depression, dizziness, Asthma who is presenting to sought a second opinion following a series of concerning symptoms that began in September, two months prior, during a bout of COVID-19, and also an abnormal EEG showing generalized epileptiform discharges.  The patient reported episodes of complete vision loss, described as "blackouts," which were accompanied by severe pain radiating down the arm. 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 The vision loss lasted approximately two minutes, during which the patient remained conscious and could hear their surroundings. She could also speak and follow commands.   The patient also reported an abnormal EKG during their COVID-19 illness, which led to extensive cardiac investigations, including stress tests, EKGs, ECGs, heart monitor, and MRI. The patient was diagnosed with heart murmur, and was prescribed Keppra (500mg  twice daily) due to black out episode and the abnormal EEG. However, the patient stopped taking Keppra due to doubts about the epilepsy diagnosis.  The patient's symptoms have been causing significant distress, with the patient reporting increased heart rate, changes  in vision, and pressure in the head during periods of stress. The patient has been seeing a therapist weekly for anxiety and depression, and has a history of taking various medications for these conditions, including Wellbutrin and Klonopin. The patient also reported a family history of seizures, with a sister who had cerebral palsy and seizures.   She is also reporting increase stress at home due to issues with her daughter, she tells me first it was a long custody battle but now, there is lots of defiance from her daughter and increase stress. She is undergoing therapy but she is not taking any prescribed medication. Her family is telling me that her anxiety and stress level is very high. They do believe that all these issues that she is having are stress related. They are encouraging her to seek help and start on medication.     OTHER MEDICAL CONDITIONS: Anxiety/Depression, Asthma, Dizziness   REVIEW OF SYSTEMS: Full 14 system review of systems performed and negative with exception of: As noted in the HPI   ALLERGIES: Allergies  Allergen Reactions   Latex Itching   Penicillins Other (See Comments)    unknown   Vicodin [Hydrocodone-Acetaminophen] Other (See Comments)    Heart racing   Hydrocodone-Acetaminophen Palpitations    HOME MEDICATIONS: Outpatient Medications Prior to Visit  Medication Sig Dispense Refill   benzonatate (TESSALON) 200 MG capsule Take 1 capsule (200 mg total) by mouth 3 (three) times daily as needed for cough. 60 capsule 1   cetirizine (ZYRTEC ALLERGY) 10 MG tablet Take 1 tablet (10 mg total) by mouth daily. 30 tablet 0   fluticasone (FLONASE) 50 MCG/ACT nasal spray Place 1 spray into both nostrils  daily.     fluticasone (FLOVENT HFA) 220 MCG/ACT inhaler Inhale 2 puffs into the lungs every 12 (twelve) hours. 1 each 5   VENTOLIN HFA 108 (90 Base) MCG/ACT inhaler Inhale 1-2 puffs into the lungs every 4 (four) hours as needed. 18 g 2   levETIRAcetam (KEPPRA) 500 MG  tablet Take 1 tablet (500 mg total) by mouth 2 (two) times daily. 60 tablet 2   predniSONE (DELTASONE) 20 MG tablet Take 1 tablet (20 mg total) by mouth daily with breakfast. 14 tablet 0   fluticasone (FLOVENT HFA) 110 MCG/ACT inhaler Inhale 1 puff into the lungs at bedtime.     No facility-administered medications prior to visit.    PAST MEDICAL HISTORY: Past Medical History:  Diagnosis Date   ADD (attention deficit disorder) 07/22/2011   ADHD (attention deficit hyperactivity disorder)    Allergic rhinitis 01/23/2011   Formatting of this note might be different from the original. Allergic Rhinitis 10/1 IMO update     Anxiety 10/03/2015   Last Assessment & Plan: Formatting of this note might be different from the original. Increased/worsening anxiety and depression.  Will restart patient on Trintellix as she reports this was a good regimen for her.  Started 10 mg.  Patient was psychiatry follow-up in 4 weeks.  We will follow-up pending evaluation.  Patient about to lose her Medicaid for her reports.  We will follow-up with family s   Asthma    Candidiasis of vagina 01/20/2023   Chest pain 05/20/2012   Depression    Fatigue 10/03/2015   Last Assessment & Plan: Formatting of this note might be different from the original. Patient with increased fatigue.  Chronic problem.  Reports history of iron deficiency anemia.  Concurrent depression.  Will treat patient for depression as well as draw basic labs for further evaluation.  Follow-up pending labs.     Genital herpes 10/16/2009   Formatting of this note might be different from the original. Herpes Simplex Type II 10/1 IMO update Last Assessment & Plan: Formatting of this note might be different from the original. Patient with past positivity for HSV 1 and 2 serotype. Notes some genital lesions in the past as well. Patient denies any recent symptoms or need for valtrex. Patient is concerned as she is in a new relationship a   Herpes simplex  01/20/2023   Herpesvirus infection 01/20/2023   History of trichomonal vaginitis 12/24/2016   Last Assessment & Plan: Formatting of this note might be different from the original. History of trichomonas- dx in clinic on 12/17/16 by Dr. Earnest Bailey. Completed treatment with Flagyl 12/20/16. Pt requesting repeat testing today. Per patient, partner was treated for trichomonas after testing positive. Recheck today to assure no reinfection. Pt verbalizes understanding for current treatment plan.     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 Depression     Postcoital bleeding 01/20/2023   Sexually transmitted disease 01/20/2023   Vaginal discharge 01/20/2023   Vitamin D deficiency 10/15/2010   Formatting of this note might be different from the original. Vitamin D Deficiency 10/1 IMO update      PAST SURGICAL HISTORY: Past Surgical History:  Procedure Laterality Date   CYSTECTOMY     Neck   Pilonidal cyst removal     Tubal implant procedure      FAMILY HISTORY:  Family History  Problem Relation Age of Onset   COPD Mother    Hypertension Mother    Glaucoma Father    High Cholesterol Father    Breast cancer Maternal Grandmother    Heart attack Maternal Grandmother    COPD Maternal Grandmother    Heart disease Maternal Grandfather    COPD Maternal Grandfather     SOCIAL HISTORY: Social History   Socioeconomic History   Marital status: Single    Spouse name: Not on file   Number of children: 3   Years of education: Not on file   Highest education level: Associate degree: academic program  Occupational History   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  Tobacco Use   Smoking status: Former    Current packs/day: 0.00    Average packs/day: 0.2 packs/day for 10.0 years (2.0 ttl pk-yrs)    Types: Cigarettes    Start date: 2013     Quit date: 2023    Years since quitting: 1.8   Smokeless tobacco: Never  Vaping Use   Vaping status: Never Used  Substance and Sexual Activity   Alcohol use: Not Currently    Comment: socially, not even one drink a week   Drug use: No   Sexual activity: Yes    Birth control/protection: Other-see comments  Other Topics Concern   Not on file  Social History Narrative   Caffeine: none   Social Determinants of Health   Financial Resource Strain: Low Risk  (07/22/2023)   Received from [facility]   Overall Financial Resource Strain (CARDIA)    Difficulty of Paying Living Expenses: Not hard at all  Food Insecurity: No Food Insecurity (07/22/2023)   [facility] from [facility]   Hunger Vital Sign    Worried About Running Out of Food in the Last Year: Never true    Ran Out of Food in the Last Year: Never true  Transportation Needs: Unmet Transportation Needs (07/22/2023)   Received from [facility] - Transportation    Lack of Transportation (Medical): No    Lack of Transportation (Non-Medical): Yes  Physical Activity: Unknown (07/22/2023)   Received from [facility]   Exercise Vital Sign    Days of Exercise per Week: Patient declined    Minutes of Exercise per Session: Not on file  Stress: No Stress Concern Present (07/22/2023)   Received from [facility] - Occupational Stress Questionnaire    Feeling of Stress : Not at all  Social Connections: Moderately Integrated (07/22/2023)   Received from [facility]   Social Network    How would you rate your social network (family, work, friends)?: Adequate participation with social networks  Intimate Partner Violence: Not At Risk (07/22/2023)   Received from [facility]   HITS    Over the last 12 months how often did your partner physically hurt you?: Never    Over the last 12 months how often did your partner insult you or talk down to you?: Never    Over the last 12  months how often did your partner threaten you with physical harm?: Never    Over the last 12 months how often did your partner scream or curse at you?: Never    PHYSICAL EXAM  GENERAL EXAM/CONSTITUTIONAL: Vitals:  Vitals:   09/08/23 1058  BP: 126/84  Resp: 16  Weight: 201 lb 8 oz (91.4 kg)  Height: 5\' 7"  (1.702 m)   Body mass index  is 31.56 kg/m. Wt Readings from Last 3 Encounters:  09/08/23 201 lb 8 oz (91.4 kg)  08/21/23 195 lb (88.5 kg)  07/23/23 199 lb 0.6 oz (90.3 kg)   Patient is in no distress; well developed, nourished and groomed; neck is supple  MUSCULOSKELETAL: Gait, strength, tone, movements noted in Neurologic exam below  NEUROLOGIC: MENTAL STATUS:      No data to display         awake, alert, oriented to person, place and time recent and remote memory intact normal attention and concentration language fluent, comprehension intact, naming intact fund of knowledge appropriate  CRANIAL NERVE:  2nd, 3rd, 4th, 6th - Visual fields full to confrontation, extraocular muscles intact, no nystagmus 5th - facial sensation symmetric 7th - facial strength symmetric 8th - hearing intact 9th - palate elevates symmetrically, uvula midline 11th - shoulder shrug symmetric 12th - tongue protrusion midline  MOTOR:  normal bulk and tone, full strength in the BUE, BLE  GAIT/STATION:  normal   DIAGNOSTIC DATA (LABS, IMAGING, TESTING) - I reviewed patient records, labs, notes, testing and imaging myself where available.  Lab Results  Component Value Date   WBC 8.5 06/26/2023   HGB 12.1 06/26/2023   HCT 40.3 06/26/2023   MCV 85.9 06/26/2023   PLT 382 06/26/2023      Component Value Date/Time   NA 143 07/24/2023 1321   K 4.8 07/24/2023 1321   CL 104 07/24/2023 1321   CO2 27 07/24/2023 1321   GLUCOSE 91 07/24/2023 1321   GLUCOSE 108 (H) 06/26/2023 2116   BUN 17 07/24/2023 1321   CREATININE 0.91 07/24/2023 1321   CALCIUM 9.7 07/24/2023 1321   PROT 6.5  07/24/2023 1321   ALBUMIN 3.9 07/24/2023 1321   AST 12 07/24/2023 1321   ALT 10 07/24/2023 1321   ALKPHOS 37 (L) 07/24/2023 1321   BILITOT 0.3 07/24/2023 1321   GFRNONAA >60 06/26/2023 2116   GFRAA >60 01/02/2018 2148   Lab Results  Component Value Date   CHOL 158 07/24/2023   HDL 55 07/24/2023   LDLCALC 86 07/24/2023   TRIG 89 07/24/2023   No results found for: "HGBA1C" No results found for: "VITAMINB12" Lab Results  Component Value Date   TSH 0.922 07/24/2023   Head CT 06/27/2023 1. Normal noncontrast CT appearance of the brain.    Routine EEG 08/14/2023 Generalized sharp and slow wave discharges   I personally reviewed brain Images and previous EEG reports.   ASSESSMENT AND PLAN  42 y.o. year old female  with past medical history of anxiety/depression, dizziness who is presenting with spells consisting of losing vision with maintained awareness, no loss of consciousness, no syncopal episodes and no seizure like activity. These events do occur in setting of high stress.  Family is also telling me that patient is currently under a lot of stress related to her daughter.  She is doing therapy but she is not on any medication for anxiety.  Again informed patient that even though her EEG was abnormal, she does not have events that are consistent with seizure therefore we should not start antiseizure medication.   Again explained to the patient that we should not treat an isolated test, we should treat the patient as a whole, and based on her description of symptoms, these are not consistent with seizure.    In terms of her anxiety, I have advised her to start Celexa, start with 10 mg daily for the first week then increase to  20 mg daily.  She voiced understanding.  She does have a therapist and I did advise her to follow-up with a therapist to get referred to a psychiatrist in the same office.  If she is unable to get with a psychiatrist, she should contact us and we will send a  referral.  Since her events are not consistent with epileptic seizures, she should resume driving if she wishes.    1. 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 Therefore, we will not start anti-seizure medication like Keppra at this time. We may consider extended EEG monitoring if future symptoms warrant it.  -ANXIETY: You are experiencing stress, palpitations, and vision changes. We will start you on Celexa (citalopram) 10 mg daily for the first week, then increase to 20mg  daily. Additionally, we will recommend that you set up care with Psychiatry for further management and medication adjustment.  INSTRUCTIONS:  Please follow up with Psychiatry for further management of your anxiety and medication adjustment. Continue your current cardiac management as directed by Cardiology. If your symptoms persist or worsen, please contact our office for further evaluation.   Per [facility] statutes, patients with seizures are not allowed to drive until they have been seizure-free for six months.  Other recommendations include using caution when using heavy equipment or power tools. Avoid working on ladders or at heights. Take showers instead of baths.  Do not swim alone.  Ensure the water temperature is not too high on the home water heater. Do not go swimming alone. Do not lock yourself in a room alone (i.e. bathroom). When caring for infants or small children, sit down when holding, feeding, or changing them to minimize risk of injury to the child in the event you have a seizure. Maintain good sleep hygiene. Avoid alcohol.  Also recommend adequate sleep, hydration, good  diet and minimize stress.   During the Seizure  - First, ensure adequate ventilation and place patients on the floor on their left side  Loosen clothing around the neck and ensure the airway is patent. If the patient is clenching the teeth, do not force the mouth open with any object as this can cause severe damage - Remove all items from the surrounding that can be hazardous. The patient may be oblivious to what's happening and may not even know what he or she is doing. If the patient is confused and wandering, either gently guide him/her away and block access to outside areas - Reassure the individual and be comforting - Call 911. In most cases, the seizure ends before EMS arrives. However, there are cases when seizures may last over 3 to 5 minutes. Or the individual may have developed breathing difficulties or severe injuries. If a pregnant patient or a person with diabetes develops a seizure, it is prudent to call an ambulance. - Finally, if the patient does not regain full consciousness, then call EMS. Most patients will remain confused for about 45 to 90 minutes after a seizure, so you must use judgment in calling for help. - Avoid restraints but make sure the patient is in a bed with padded side rails - Place the individual in  a lateral position with the neck slightly flexed; this will help the saliva drain from the mouth and prevent the tongue from falling backward - Remove all nearby furniture and other hazards from the area - Provide verbal assurance as the individual is regaining consciousness - Provide the patient with privacy if possible - Call for help and start treatment as ordered by the caregiver   After the Seizure (Postictal Stage)  After a seizure, most patients experience confusion, fatigue, muscle pain and/or a headache. Thus, one should permit the individual to sleep. For the next few days, reassurance is essential. Being calm and helping reorient the person is also of  importance.  Most seizures are painless and end spontaneously. Seizures are not harmful to others but can lead to complications such as stress on the lungs, brain and the heart. Individuals with prior lung problems may develop labored breathing and respiratory distress.     No orders of the defined types were placed in this encounter.   Meds ordered this encounter  Medications   citalopram (CELEXA) 20 MG tablet    Sig: Take 1 tablet (20 mg total) by mouth daily.    Dispense:  30 tablet    Refill:  6    Return if symptoms worsen or fail to improve.  I have spent a total of 60 minutes dedicated to this patient today, preparing to see patient, performing a medically appropriate examination and evaluation, ordering tests and/or medications and procedures, and counseling and educating the patient/family/caregiver; independently interpreting result and communicating results to the family/patient/caregiver; and documenting clinical information in the electronic medical record.   Windell Norfolk, MD 09/08/2023, 5:53 PM  [facility] [address], [address] [place], [place] [address] [phone]

## 2023-09-15 ENCOUNTER — Telehealth: Payer: Self-pay

## 2023-09-15 NOTE — Telephone Encounter (Signed)
-----   Message from Armanda Magic sent at 08/24/2023  9:01 PM EDT ----- Please let patient know that sleep study showed no significant sleep apnea.

## 2023-09-15 NOTE — Telephone Encounter (Signed)
Notified patient of sleep study results and recommendations. All questions were answered and patient verbalized understanding.

## 2023-09-22 ENCOUNTER — Other Ambulatory Visit: Payer: Self-pay

## 2023-09-22 ENCOUNTER — Emergency Department (HOSPITAL_COMMUNITY)
Admission: EM | Admit: 2023-09-22 | Discharge: 2023-09-23 | Disposition: A | Discharge status: Home / Self Care | Payer: Medicaid Other | Attending: Emergency Medicine | Admitting: Emergency Medicine

## 2023-09-22 ENCOUNTER — Ambulatory Visit
Admission: EM | Admit: 2023-09-22 | Discharge: 2023-09-22 | Disposition: A | Discharge status: Home / Self Care | Payer: Medicaid Other | Attending: Internal Medicine | Admitting: Internal Medicine

## 2023-09-22 ENCOUNTER — Emergency Department (HOSPITAL_COMMUNITY): Payer: Medicaid Other

## 2023-09-22 ENCOUNTER — Ambulatory Visit (INDEPENDENT_AMBULATORY_CARE_PROVIDER_SITE_OTHER): Payer: Medicaid Other

## 2023-09-22 ENCOUNTER — Encounter (HOSPITAL_COMMUNITY): Payer: Self-pay | Admitting: Emergency Medicine

## 2023-09-22 DIAGNOSIS — D72829 Elevated white blood cell count, unspecified: Secondary | ICD-10-CM | POA: Insufficient documentation

## 2023-09-22 DIAGNOSIS — R002 Palpitations: Secondary | ICD-10-CM | POA: Insufficient documentation

## 2023-09-22 DIAGNOSIS — R059 Cough, unspecified: Secondary | ICD-10-CM | POA: Insufficient documentation

## 2023-09-22 DIAGNOSIS — R053 Chronic cough: Secondary | ICD-10-CM

## 2023-09-22 DIAGNOSIS — Z91040 Latex allergy status: Secondary | ICD-10-CM | POA: Insufficient documentation

## 2023-09-22 DIAGNOSIS — R0602 Shortness of breath: Secondary | ICD-10-CM

## 2023-09-22 LAB — BASIC METABOLIC PANEL
Anion gap: 8 (ref 5–15)
BUN: 12 mg/dL (ref 6–20)
CO2: 21 mmol/L — ABNORMAL LOW (ref 22–32)
Calcium: 9 mg/dL (ref 8.9–10.3)
Chloride: 106 mmol/L (ref 98–111)
Creatinine, Ser: 0.78 mg/dL (ref 0.44–1.00)
GFR, Estimated: >60 mL/min (ref >60)
Glucose, Bld: 125 mg/dL — ABNORMAL HIGH (ref 70–99)
Potassium: 3.6 mmol/L (ref 3.5–5.1)
Sodium: 135 mmol/L (ref 135–145)

## 2023-09-22 LAB — CBC
HCT: 38.2 % (ref 36.0–46.0)
Hemoglobin: 12.1 g/dL (ref 12.0–15.0)
MCH: 26.8 pg (ref 26.0–34.0)
MCHC: 31.7 g/dL (ref 30.0–36.0)
MCV: 84.5 fL (ref 80.0–100.0)
Platelets: 396 10*3/uL (ref 150–400)
RBC: 4.52 MIL/uL (ref 3.87–5.11)
RDW: 13.7 % (ref 11.5–15.5)
WBC: 17.7 10*3/uL — ABNORMAL HIGH (ref 4.0–10.5)
nRBC: 0 % (ref 0.0–0.2)

## 2023-09-22 LAB — D-DIMER, QUANTITATIVE: D-Dimer, Quant: 0.4 ug{FEU}/mL (ref 0.00–0.50)

## 2023-09-22 LAB — HCG, SERUM, QUALITATIVE: Preg, Serum: NEGATIVE

## 2023-09-22 LAB — TROPONIN I (HIGH SENSITIVITY): Troponin I (High Sensitivity): <2 ng/L (ref <18)

## 2023-09-22 NOTE — Discharge Instructions (Addendum)
Please go to the nearest emergency department for further workup and evaluation.

## 2023-09-22 NOTE — ED Triage Notes (Signed)
Pt presents with c/o persistent cough x 3 months.   Pt states her HR has been elevated and states her PCP told her to come to the urgent care to be checked

## 2023-09-22 NOTE — ED Provider Notes (Signed)
[facility]    CSN: [id number] Arrival date & time: 09/22/23  1655      History   Chief Complaint No chief complaint on file.   HPI Natalie Colon is a 42 y.o. female.   Natalie Colon is a 42 y.o. female presenting for chief complaint of cough and shortness of breath that started approximately 3 months ago but worsened approximately 1 to 2 weeks ago.  Cough is persistent, dry, and described as intermittently "barky". She feels like "something needs to come up with the cough but it cannot".  Cough is worse at nighttime.  Close sick contact currently has pneumonia, she wonders if she could also have pneumonia.  Denies recent fevers, chills, nausea, vomiting, diarrhea, abdominal pain, rash, chest pain, and dizziness.  She notes her Apple Watch alerted her of elevated heart rate today in between the 130s and 140s when coughing.  Heart rate has been elevated in the 110s today for Apple Watch.  Additionally, patient was diagnosed with COVID-19 in September 2024 and has had persistent cough ever since.  She has been seeing pulmonology who has recommended use of Flovent, albuterol, and Tessalon Perles.   Denies recent antibiotic use, leg swelling, and orthopnea. She has had multiple prednisone courses over the last 3 months.  Former cigarette smoker, exposed to secondhand smoke home.  States symptoms worsened significantly over the last 1 to 2 weeks.     Past Medical History:  Diagnosis Date   ADD (attention deficit disorder) 07/22/2011   ADHD (attention deficit hyperactivity disorder)    Allergic rhinitis 01/23/2011   Formatting of this note might be different from the original. Allergic Rhinitis 10/1 IMO update     Anxiety 10/03/2015   Last Assessment & Plan: Formatting of this note might be different from the original. Increased/worsening anxiety and depression.  Will restart patient on Trintellix as she reports this was a good regimen for her.  Started 10 mg.  Patient was  psychiatry follow-up in 4 weeks.  We will follow-up pending evaluation.  Patient about to lose her Medicaid for her reports.  We will follow-up with family s   Asthma    Candidiasis of vagina 01/20/2023   Chest pain 05/20/2012   Depression    Fatigue 10/03/2015   Last Assessment & Plan: Formatting of this note might be different from the original. Patient with increased fatigue.  Chronic problem.  Reports history of iron deficiency anemia.  Concurrent depression.  Will treat patient for depression as well as draw basic labs for further evaluation.  Follow-up pending labs.     Genital herpes 10/16/2009   Formatting of this note might be different from the original. Herpes Simplex Type II 10/1 IMO update Last Assessment & Plan: Formatting of this note might be different from the original. Patient with past positivity for HSV 1 and 2 serotype. Notes some genital lesions in the past as well. Patient denies any recent symptoms or need for valtrex. Patient is concerned as she is in a new relationship a   Herpes simplex 01/20/2023   Herpesvirus infection 01/20/2023   History of trichomonal vaginitis 12/24/2016   Last Assessment & Plan: Formatting of this note might be different from the original. History of trichomonas- dx in clinic on 12/17/16 by Dr. Earnest Bailey. Completed treatment with Flagyl 12/20/16. Pt requesting repeat testing today. Per patient, partner was treated for trichomonas after testing positive. Recheck today to assure no reinfection. Pt verbalizes understanding for current treatment plan.  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 Depression     Postcoital bleeding 01/20/2023   Sexually transmitted disease 01/20/2023   Vaginal discharge 01/20/2023   Vitamin D deficiency 10/15/2010   Formatting of this note might be different from  the original. Vitamin D Deficiency 10/1 IMO update      Patient Active Problem List   Diagnosis Date Noted   Cardiac murmur 07/23/2023   Snoring 07/23/2023   Obesity (BMI 30.0-34.9) 07/23/2023   Xanthelasma of eyelid 07/23/2023   Palpitations 07/23/2023   ADHD (attention deficit hyperactivity disorder)    Depression    Candidiasis of vagina 01/20/2023   Herpes simplex 01/20/2023   Herpesvirus infection 01/20/2023   Postcoital bleeding 01/20/2023   Sexually transmitted disease 01/20/2023   Vaginal discharge 01/20/2023   History of trichomonal vaginitis 12/24/2016   Anxiety 10/03/2015   Fatigue 10/03/2015   Chest pain 05/20/2012   ADD (attention deficit disorder) 07/22/2011   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      Home Medications    Prior to Admission medications   Medication Sig Start Date End Date Taking? Authorizing Provider  benzonatate (TESSALON) 200 MG capsule Take 1 capsule (200 mg total) by mouth 3 (three) times daily as needed for cough. 08/21/23   Tomma Lightning, MD  cetirizine (ZYRTEC ALLERGY) 10 MG tablet Take 1 tablet (10 mg total) by mouth daily. 07/11/23   Wallis Bamberg, PA-C  citalopram (CELEXA) 20 MG tablet Take 1 tablet (20 mg total) by mouth daily. 09/08/23 04/05/24  Windell Norfolk, MD  fluticasone (FLONASE) 50 MCG/ACT nasal spray Place 1 spray into both nostrils daily. 07/22/23   [provider]  fluticasone (FLOVENT HFA) 220 MCG/ACT inhaler Inhale 2 puffs into the lungs every 12 (twelve) hours. 08/21/23   Olalere, Minna Antis, MD  VENTOLIN HFA 108 (90 Base) MCG/ACT inhaler Inhale  1-2 puffs into the lungs every 4 (four) hours as needed. 08/21/23   Tomma Lightning, MD    Family History Family History  Problem Relation Age of Onset   COPD Mother    Hypertension Mother    Glaucoma Father    High Cholesterol Father    Breast cancer Maternal Grandmother    Heart attack Maternal Grandmother    COPD Maternal Grandmother    Heart disease Maternal Grandfather    COPD Maternal Grandfather     Social History Social History   Tobacco Use   Smoking status: Former    Current packs/day: 0.00    Average packs/day: 0.2 packs/day for 10.0 years (2.0 ttl pk-yrs)    Types: Cigarettes    Start date: 2013    Quit date: 2023    Years since quitting: 1.9   Smokeless tobacco: Never  Vaping Use   Vaping status: Never Used  Substance Use Topics   Alcohol use: Not Currently    Comment: socially, not even one drink a week  Drug use: No     Allergies   Latex, Penicillins, Vicodin [hydrocodone-acetaminophen], and Hydrocodone-acetaminophen   Review of Systems Review of Systems Per HPI  Physical Exam Triage Vital Signs ED Triage Vitals  Encounter Vitals Group     BP 09/22/23 1738 118/83     Systolic BP Percentile --      Diastolic BP Percentile --      Pulse Rate 09/22/23 1738 (!) 111     Resp 09/22/23 1738 20     Temp 09/22/23 1738 98.4 F (36.9 C)     Temp Source 09/22/23 1738 Oral     SpO2 09/22/23 1738 96 %     Weight --      Height --      Head Circumference --      Peak Flow --      Pain Score 09/22/23 1737 0     Pain Loc --      Pain Education --      Exclude from Growth Chart --    No data found.  Updated Vital Signs BP 118/83 (BP Location: Right Arm)   Pulse (!) 111   Temp 98.4 F (36.9 C) (Oral)   Resp 20   LMP 09/01/2023 (Exact Date)   SpO2 96%   Visual Acuity Right Eye Distance:   Left Eye Distance:   Bilateral Distance:    Right Eye Near:   Left Eye Near:    Bilateral Near:     Physical Exam Vitals and nursing note  reviewed.  Constitutional:      Appearance: She is ill-appearing. She is not toxic-appearing.  HENT:     Head: Normocephalic and atraumatic.     Right Ear: Hearing, tympanic membrane, ear canal and external ear normal.     Left Ear: Hearing, tympanic membrane, ear canal and external ear normal.     Nose: Nose normal.     Mouth/Throat:     Lips: Pink.     Mouth: Mucous membranes are moist. No injury.     Tongue: No lesions. Tongue does not deviate from midline.     Palate: No mass and lesions.     Pharynx: Oropharynx is clear. Uvula midline. No pharyngeal swelling, oropharyngeal exudate, posterior oropharyngeal erythema or uvula swelling.     Tonsils: No tonsillar exudate or tonsillar abscesses.  Eyes:     General: Lids are normal. Vision grossly intact. Gaze aligned appropriately.     Extraocular Movements: Extraocular movements intact.     Conjunctiva/sclera: Conjunctivae normal.  Cardiovascular:     Rate and Rhythm: Normal rate and regular rhythm.     Heart sounds: S1 normal and S2 normal. Murmur (Baseline) heard.  Pulmonary:     Effort: Pulmonary effort is normal. No respiratory distress.     Breath sounds: Normal breath sounds and air entry. No wheezing, rhonchi or rales.     Comments: Decreased breath sounds throughout bilaterally.  Speaking in full sentences with mild increased respiratory effort and difficulty.  Frequent harsh and dry cough on exam. Chest:     Chest wall: No tenderness.  Musculoskeletal:     Cervical back: Neck supple.  Skin:    General: Skin is warm and dry.     Capillary Refill: Capillary refill takes less than 2 seconds.     Findings: No rash.  Neurological:     General: No focal deficit present.     Mental Status: She is alert and oriented to person, place, and time. Mental  status is at baseline.     Cranial Nerves: No dysarthria or facial asymmetry.  Psychiatric:        Mood and Affect: Mood normal.        Speech: Speech normal.        Behavior:  Behavior normal.        Thought Content: Thought content normal.        Judgment: Judgment normal.      UC Treatments / Results  Labs (all labs ordered are listed, but only abnormal results are displayed) Labs Reviewed - No data to display  EKG   Radiology DG Chest 2 View  Result Date: 09/22/2023 CLINICAL DATA:  Cough EXAM: CHEST - 2 VIEW COMPARISON:  06/26/2023 FINDINGS: Linear subsegmental atelectasis in the lingula. Right lung clear. No effusions. Heart and mediastinal contours within normal limits. No acute bony abnormality. IMPRESSION: No active disease. Electronically Signed   By: Charlett Nose M.D.   On: 09/22/2023 19:03    Procedures Procedures (including critical care time)  Medications Ordered in UC Medications - No data to display  Initial Impression / Assessment and Plan / UC Course  I have reviewed the triage vital signs and the nursing notes.  Pertinent labs & imaging results that were available during my care of the patient were reviewed by me and considered in my medical decision making (see chart for details).   1.  Shortness of breath, chronic cough, heart palpitations EKG shows sinus tachycardia with ventricular rate of 111 without ST/T wave changes. Chest x-ray shows no active cardiopulmonary disease. Patient remains symptomatically short of breath with frequent harsh cough. Symptoms have not responded well to multiple courses of prednisone, Flovent, albuterol, and Tessalon Perles/Promethazine DM. Recently diagnosed with mitral valve prolapse, murmur heard on exam.  Clinical concern for underlying intrathoracic pathology given persistent cough, worsening shortness of breath.  Recommend further workup in the emergency department setting as I believe patient would benefit from serial troponins and urgent blood work that I am unable to provide in the urgent care setting.  She is stable to go to the nearest emergency department with her husband.  Discussed  recommendations and plan with patient and husband who expressed understanding and agreement with plan.  Discharged from urgent care in stable condition.  Final Clinical Impressions(s) / UC Diagnoses   Final diagnoses:  Shortness of breath  Chronic cough  Heart palpitations     Discharge Instructions      Please go to the nearest emergency department for further workup and evaluation.      ED Prescriptions   None    PDMP not reviewed this encounter.   Carlisle Beers, [place] 09/22/23 1914

## 2023-09-22 NOTE — ED Triage Notes (Addendum)
Patient states she was seen at [facility] for cough x3 weeks and palpitations and sent to ER for further workup due to [facility] provider seeing possible changes on EKG. Patient states she had alert on watch that her heart rate was elevated earlier. Denies chest pain. Endorses shortness of breath.

## 2023-09-22 NOTE — ED Provider Notes (Signed)
[place] EM[facility] DE[facility]MENT AT [facility] Provider Note   CSN: [id number] Arrival date & time: 09/22/23  1906     History {Add pertinent medical, surgical, social history, OB history to HPI:1} Chief Complaint  Patient presents with  . Palpitations    Natalie Colon is a 42 y.o. female.   Palpitations    Patient presents to the ED for evaluation of cough and shortness of breath.  Patient states she has been having symptoms now for months after a COVID infection.  Patient states in the last 1 to 2 weeks it got worse again.  She has a persistent cough which she feels like something needs to come up.  Patient states she is also noticed that her heart rate has been very fast.  At times it has been up to 140.  Patient has followed up with pulmonology.  They have recommended Flovent albuterol and Tessalon Perles.  Patient denies any leg swelling.  No prior history of PE or DVT  Home Medications Prior to Admission medications   Medication Sig Start Date End Date Taking? Authorizing Provider  benzonatate (TESSALON) 200 MG capsule Take 1 capsule (200 mg total) by mouth 3 (three) times daily as needed for cough. 08/21/23   Tomma Lightning, MD  cetirizine (ZYRTEC ALLERGY) 10 MG tablet Take 1 tablet (10 mg total) by mouth daily. 07/11/23   Wallis Bamberg, PA-C  citalopram (CELEXA) 20 MG tablet Take 1 tablet (20 mg total) by mouth daily. 09/08/23 04/05/24  Windell Norfolk, MD  fluticasone (FLONASE) 50 MCG/ACT nasal spray Place 1 spray into both nostrils daily. 07/22/23   [provider]  fluticasone (FLOVENT HFA) 220 MCG/ACT inhaler Inhale 2 puffs into the lungs every 12 (twelve) hours. 08/21/23   Olalere, Minna Antis, MD  VENTOLIN HFA 108 (90 Base) MCG/ACT inhaler Inhale 1-2 puffs into the lungs every 4 (four) hours as needed. 08/21/23   Tomma Lightning, MD      Allergies    Latex, Penicillins, Vicodin [hydrocodone-acetaminophen], and Hydrocodone-acetaminophen     Review of Systems   Review of Systems  Cardiovascular:  Positive for palpitations.    Physical Exam Updated Vital Signs BP (!) 145/87   Pulse 90   Temp 98.2 F (36.8 C) (Oral)   Resp 13   Ht 1.702 m (5\' 7" )   Wt 86.2 kg   LMP 09/01/2023 (Exact Date)   SpO2 98%   BMI 29.76 kg/m  Physical Exam Vitals and nursing note reviewed.  Constitutional:      Appearance: She is well-developed. She is not diaphoretic.  HENT:     Head: Normocephalic and atraumatic.     Right Ear: External ear normal.     Left Ear: External ear normal.  Eyes:     General: No scleral icterus.       Right eye: No discharge.        Left eye: No discharge.     Conjunctiva/sclera: Conjunctivae normal.  Neck:     Trachea: No tracheal deviation.  Cardiovascular:     Rate and Rhythm: Normal rate and regular rhythm.  Pulmonary:     Effort: Pulmonary effort is normal. No respiratory distress.     Breath sounds: Normal breath sounds. No stridor. No wheezing or rales.     Comments: Coughing at the bedside Abdominal:     General: Bowel sounds are normal. There is no distension.     Palpations: Abdomen is soft.     Tenderness: There  is no abdominal tenderness. There is no guarding or rebound.  Musculoskeletal:        General: No tenderness or deformity.     Cervical back: Neck supple.  Skin:    General: Skin is warm and dry.     Findings: No rash.  Neurological:     General: No focal deficit present.     Mental Status: She is alert.     Cranial Nerves: No cranial nerve deficit, dysarthria or facial asymmetry.     Sensory: No sensory deficit.     Motor: No abnormal muscle tone or seizure activity.     Coordination: Coordination normal.  Psychiatric:        Mood and Affect: Mood normal.     ED Results / Procedures / Treatments   Labs (all labs ordered are listed, but only abnormal results are displayed) Labs Reviewed  BASIC METABOLIC PANEL - Abnormal; Notable for the following components:       Result Value   CO2 21 (*)    Glucose, Bld 125 (*)    All other components within normal limits  CBC - Abnormal; Notable for the following components:   WBC 17.7 (*)    All other components within normal limits  HCG, SERUM, QUALITATIVE  TROPONIN I (HIGH SENSITIVITY)    EKG EKG Interpretation Date/Time:  Monday September 22 2023 19:30:47 EST Ventricular Rate:  105 PR Interval:  120 QRS Duration:  67 QT Interval:  332 QTC Calculation: 439 R Axis:   0  Text Interpretation: Sinus tachycardia Confirmed by Linwood Dibbles [phone]) on 09/22/2023 9:11:55 PM  Radiology DG Chest 2 View  Result Date: 09/22/2023 CLINICAL DATA:  Cough EXAM: CHEST - 2 VIEW COMPARISON:  06/26/2023 FINDINGS: Linear subsegmental atelectasis in the lingula. Right lung clear. No effusions. Heart and mediastinal contours within normal limits. No acute bony abnormality. IMPRESSION: No active disease. Electronically Signed   By: Charlett Nose M.D.   On: 09/22/2023 19:03    Procedures Procedures  {Document cardiac monitor, telemetry assessment procedure when appropriate:1}  Medications Ordered in ED Medications - No data to display  ED Course/ Medical Decision Making/ A&P Clinical Course as of 09/22/23 2149  Mon Sep 22, 2023  2149 CBC does show leukocytosis of 17.  Metabolic panel normal.  Patient is currently on steroids.  Troponin normal.  Pregnancy test negative [JK]    Clinical Course User Index [JK] Linwood Dibbles, MD   {   Click here for ABCD2, HEART and other calculatorsREFRESH Note before signing :1}                              Medical Decision Making Amount and/or Complexity of Data Reviewed Labs: ordered. Radiology: ordered.   ***  {Document critical care time when appropriate:1} {Document review of labs and clinical decision tools ie heart score, Chads2Vasc2 etc:1}  {Document your independent review of radiology images, and any outside records:1} {Document your discussion with family members,  caretakers, and with consultants:1} {Document social determinants of health affecting pt's care:1} {Document your decision making why or why not admission, treatments were needed:1} Final Clinical Impression(s) / ED Diagnoses Final diagnoses:  None    Rx / DC Orders ED Discharge Orders     None

## 2023-09-23 ENCOUNTER — Encounter: Payer: Self-pay | Admitting: Cardiology

## 2023-09-23 ENCOUNTER — Telehealth: Payer: Self-pay | Admitting: Cardiovascular Disease

## 2023-09-23 NOTE — Telephone Encounter (Signed)
Spoke with pt who states that she was seen at [facility] and sent to the ED for evaluation of EKG changes and rapid heart rate. Pt has requested a switch from Dr. Tomie China to Dr. Clifton James. Pt states that they wanted her to be seen soon and her appointment is 10/02/23. Can she be seen sooner in your office as they is where she is requesting.

## 2023-09-23 NOTE — Telephone Encounter (Signed)
I spoke with patient.  She was seen in Urgent Care yesterday and sent to ED.  ED note not complete at this time. From after visit summary  instructions-  The test today in the ED were reassuring. No signs of blood clot or heart injury. Continue to follow-up with your cardiology and pulmonary doctor for further evaluation  Patient has appointment with Dr Clifton James on 12/5 and states she was told to contact office to see if sooner appointment available.  Patient wore monitor and had stress echo recently.  She has had a cough since September and is coughing while talking on the phone today. No fever  States X ray did not show pneumonia.  No new medications prescribed by ED.  Has been on 2-3 recent treatments of prednisone but is not currently taking. Is taking fluticasone inhaler.  Patient reports heart rate will go up at times.  I explained to her that illness and inhalers could contribute to increased heart rate.  I advised patient to follow up with Dr Clifton James as planned and let her know I would send message to him to see if any testing needed prior to this appointment.

## 2023-09-23 NOTE — Telephone Encounter (Signed)
Patient c/o Palpitations:  High priority if patient c/o lightheadedness, shortness of breath, or chest pain  How long have you had palpitations/irregular HR/ Afib? Are you having the symptoms now? A few days ago and no   Are you currently experiencing lightheadedness, SOB or CP? No   Do you have a history of afib (atrial fibrillation) or irregular heart rhythm? Yes   Have you checked your BP or HR? (document readings if available): BP- 135/94, HR - 74 Are you experiencing any other symptoms? A cough

## 2023-09-23 NOTE — Discharge Instructions (Addendum)
The test today in the ED were reassuring.  No signs of blood clot or heart injury.  Continue to follow-up with your cardiology and pulmonary doctor for further evaluation

## 2023-09-24 MED ORDER — METOPROLOL SUCCINATE ER 25 MG PO TB24: 25.0000 mg | ORAL_TABLET | Freq: Every day | ORAL | 3 refills | Status: DC

## 2023-09-27 ENCOUNTER — Encounter (HOSPITAL_COMMUNITY): Payer: Self-pay | Admitting: Emergency Medicine

## 2023-09-27 ENCOUNTER — Ambulatory Visit (HOSPITAL_COMMUNITY)
Admission: EM | Admit: 2023-09-27 | Discharge: 2023-09-27 | Disposition: A | Discharge status: Home / Self Care | Payer: Medicaid Other | Attending: Internal Medicine | Admitting: Internal Medicine

## 2023-09-27 DIAGNOSIS — J0100 Acute maxillary sinusitis, unspecified: Secondary | ICD-10-CM

## 2023-09-27 MED ORDER — AZITHROMYCIN 250 MG PO TABS
ORAL_TABLET | ORAL | 0 refills | Status: DC
Start: 2023-09-27 — End: 2023-10-02

## 2023-09-27 NOTE — Discharge Instructions (Addendum)
Likely sinus infection given recent fevers and green mucus drainage and can treat with the following:   Azithromycin 250 mg take 2 tablets today and then 1 tablet daily for 4 days Ok to take plain Mucinex but not with D or DM Rest and hydrate Keep follow up with cardiology and pulmonology appointments Return to urgent care or PCP if symptoms worsen or fail to resolve.

## 2023-09-27 NOTE — ED Triage Notes (Signed)
Reports first noticing cough in September.  Patient reports cough and heart palpitations.  Patient states palpitations are under control.  Patient has a pulmonary appt in January, cardiac appt next week. Patient has a multitude of concerns and questions.    Patient is here today due to having green mucus.  Patient reports a chest xray recently, but was told it was clear and not understanding if clear, why she has phlegm

## 2023-09-27 NOTE — ED Provider Notes (Signed)
[facility]    CSN: [id number] Arrival date & time: 09/27/23  1722      History   Chief Complaint Chief Complaint  Patient presents with   Cough    HPI Natalie Colon is a 42 y.o. female.   42 year old female who is seen in urgent care secondary to productive cough.  She was just seen here 5 days ago with a lingering cough that has been ongoing for months.  The cough has not changed but has developed a greenish mucus when she coughs.  She has follow-ups with cardiology and pulmonology secondary to palpitations and the persistent cough.  She feels that this is long COVID as she started having symptoms after she was diagnosed with COVID.  She was recently started on metoprolol and read online that she should not take Mucinex DM with it which is what she was using for congestion.  She reports that the promethazine DM does not help so she has been using Tessalon pearls 3 times daily.   Cough Associated symptoms: no chest pain, no chills, no ear pain, no fever, no rash, no shortness of breath and no sore throat     Past Medical History:  Diagnosis Date   ADD (attention deficit disorder) 07/22/2011   ADHD (attention deficit hyperactivity disorder)    Allergic rhinitis 01/23/2011   Formatting of this note might be different from the original. Allergic Rhinitis 10/1 IMO update     Anxiety 10/03/2015   Last Assessment & Plan: Formatting of this note might be different from the original. Increased/worsening anxiety and depression.  Will restart patient on Trintellix as she reports this was a good regimen for her.  Started 10 mg.  Patient was psychiatry follow-up in 4 weeks.  We will follow-up pending evaluation.  Patient about to lose her Medicaid for her reports.  We will follow-up with family s   Asthma    Candidiasis of vagina 01/20/2023   Chest pain 05/20/2012   Depression    Fatigue 10/03/2015   Last Assessment & Plan: Formatting of this note might be different from  the original. Patient with increased fatigue.  Chronic problem.  Reports history of iron deficiency anemia.  Concurrent depression.  Will treat patient for depression as well as draw basic labs for further evaluation.  Follow-up pending labs.     Genital herpes 10/16/2009   Formatting of this note might be different from the original. Herpes Simplex Type II 10/1 IMO update Last Assessment & Plan: Formatting of this note might be different from the original. Patient with past positivity for HSV 1 and 2 serotype. Notes some genital lesions in the past as well. Patient denies any recent symptoms or need for valtrex. Patient is concerned as she is in a new relationship a   Herpes simplex 01/20/2023   Herpesvirus infection 01/20/2023   History of trichomonal vaginitis 12/24/2016   Last Assessment & Plan: Formatting of this note might be different from the original. History of trichomonas- dx in clinic on 12/17/16 by Dr. Earnest Bailey. Completed treatment with Flagyl 12/20/16. Pt requesting repeat testing today. Per patient, partner was treated for trichomonas after testing positive. Recheck today to assure no reinfection. Pt verbalizes understanding for current treatment plan.     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 Depression  Postcoital bleeding 01/20/2023   Sexually transmitted disease 01/20/2023   Vaginal discharge 01/20/2023   Vitamin D deficiency 10/15/2010   Formatting of this note might be different from the original. Vitamin D Deficiency 10/1 IMO update      Patient Active Problem List   Diagnosis Date Noted   Cardiac murmur 07/23/2023   Snoring 07/23/2023   Obesity (BMI 30.0-34.9) 07/23/2023   Xanthelasma of eyelid 07/23/2023   Palpitations 07/23/2023   ADHD (attention deficit hyperactivity disorder)    Depression     Candidiasis of vagina 01/20/2023   Herpes simplex 01/20/2023   Herpesvirus infection 01/20/2023   Postcoital bleeding 01/20/2023   Sexually transmitted disease 01/20/2023   Vaginal discharge 01/20/2023   History of trichomonal vaginitis 12/24/2016   Anxiety 10/03/2015   Fatigue 10/03/2015   Chest pain 05/20/2012   ADD (attention deficit disorder) 07/22/2011   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      Home Medications    Prior to Admission medications   Medication Sig Start Date End Date Taking? Authorizing Provider  benzonatate (TESSALON) 200 MG capsule Take 1 capsule (200 mg total) by mouth 3 (three) times daily as needed for cough. 08/21/23   Tomma Lightning, MD  cetirizine (ZYRTEC ALLERGY) 10 MG tablet Take 1 tablet (10 mg total) by mouth daily. 07/11/23   Wallis Bamberg, PA-C  citalopram (CELEXA) 20 MG tablet Take 1 tablet (20 mg total) by mouth daily. Patient not taking: Reported on 09/27/2023 09/08/23 04/05/24  Windell Norfolk, MD  fluticasone (FLONASE) 50 MCG/ACT nasal spray Place 1 spray into both nostrils daily. 07/22/23   [provider]  fluticasone (FLOVENT HFA) 220 MCG/ACT inhaler Inhale 2 puffs into the lungs every 12 (twelve) hours. 08/21/23   Virl Diamond A, MD  metoprolol succinate (TOPROL XL) 25 MG 24 hr tablet Take 1 tablet (25 mg total) by mouth daily. 09/24/23   Revankar, Aundra Dubin, MD  VENTOLIN HFA 108 (90 Base) MCG/ACT inhaler Inhale 1-2 puffs into the lungs every 4 (four) hours as needed. 08/21/23   Tomma Lightning, MD    Family History Family History  Problem Relation Age of Onset    COPD Mother    Hypertension Mother    Glaucoma Father    High Cholesterol Father    Breast cancer Maternal Grandmother    Heart attack Maternal Grandmother    COPD Maternal Grandmother    Heart disease Maternal Grandfather    COPD Maternal Grandfather     Social History Social History   Tobacco Use   Smoking status: Former    Current packs/day: 0.00    Average packs/day: 0.2 packs/day for 10.0 years (2.0 ttl pk-yrs)    Types: Cigarettes    Start date: 2013    Quit date: 2023    Years since quitting: 1.9   Smokeless tobacco: Never  Vaping Use   Vaping status: Never Used  Substance Use Topics   Alcohol use: Not Currently    Comment: socially, not even one drink a week   Drug use: No  Allergies   Latex, Penicillins, Vicodin [hydrocodone-acetaminophen], and Hydrocodone-acetaminophen   Review of Systems Review of Systems  Constitutional:  Negative for chills and fever.  HENT:  Positive for congestion. Negative for ear pain and sore throat.   Eyes:  Negative for pain and visual disturbance.  Respiratory:  Positive for cough. Negative for shortness of breath.   Cardiovascular:  Negative for chest pain and palpitations.  Gastrointestinal:  Negative for abdominal pain and vomiting.  Genitourinary:  Negative for dysuria and hematuria.  Musculoskeletal:  Negative for arthralgias and back pain.  Skin:  Negative for color change and rash.  Neurological:  Negative for seizures and syncope.  All other systems reviewed and are negative.    Physical Exam Triage Vital Signs ED Triage Vitals  Encounter Vitals Group     BP 09/27/23 1757 119/81     Systolic BP Percentile --      Diastolic BP Percentile --      Pulse Rate 09/27/23 1757 75     Resp 09/27/23 1757 20     Temp 09/27/23 1757 98.2 F (36.8 C)     Temp Source 09/27/23 1757 Oral     SpO2 09/27/23 1757 96 %     Weight --      Height --      Head Circumference --      Peak Flow --      Pain Score 09/27/23  1754 0     Pain Loc --      Pain Education --      Exclude from Growth Chart --    No data found.  Updated Vital Signs BP 119/81 (BP Location: Left Arm)   Pulse 75   Temp 98.2 F (36.8 C) (Oral)   Resp 20   LMP 09/26/2023 (Exact Date)   SpO2 96%   Visual Acuity Right Eye Distance:   Left Eye Distance:   Bilateral Distance:    Right Eye Near:   Left Eye Near:    Bilateral Near:     Physical Exam Vitals and nursing note reviewed.  Constitutional:      General: She is not in acute distress.    Appearance: She is well-developed.  HENT:     Head: Normocephalic and atraumatic.     Right Ear: Tympanic membrane normal.     Left Ear: Tympanic membrane normal.  Eyes:     Conjunctiva/sclera: Conjunctivae normal.  Cardiovascular:     Rate and Rhythm: Normal rate and regular rhythm.     Heart sounds: No murmur heard. Pulmonary:     Effort: Pulmonary effort is normal. No respiratory distress.     Breath sounds: Normal breath sounds.  Abdominal:     Palpations: Abdomen is soft.     Tenderness: There is no abdominal tenderness.  Musculoskeletal:        General: No swelling.     Cervical back: Neck supple.  Skin:    General: Skin is warm and dry.     Capillary Refill: Capillary refill takes less than 2 seconds.  Neurological:     Mental Status: She is alert.  Psychiatric:        Mood and Affect: Mood normal.      UC Treatments / Results  Labs (all labs ordered are listed, but only abnormal results are displayed) Labs Reviewed - No data to display  EKG   Radiology No results found.  Procedures Procedures (including critical care time)  Medications Ordered in UC Medications -  No data to display  Initial Impression / Assessment and Plan / UC Course  I have reviewed the triage vital signs and the nursing notes.  Pertinent labs & imaging results that were available during my care of the patient were reviewed by me and considered in my medical decision making  (see chart for details).     Acute non-recurrent maxillary sinusitis   Likely sinus infection given recent fevers and green mucus drainage and can treat with the following:   Azithromycin 250 mg take 2 tablets today and then 1 tablet daily for 4 days Ok to take plain Mucinex but not with D or DM Rest and hydrate Keep follow up with cardiology and pulmonology appointments Return to urgent care or PCP if symptoms worsen or fail to resolve.     Long discussion with the patient and her significant other that is in the room with her.  In regards to taking her inhaler with metoprolol, I recommend discussing this with her cardiologist when they see them on Thursday.  They should also bring the EKG to to their cardiology appointment to have them discuss the results of this from when she was in the hospital recently as this would be more appropriate for them to review with her.  In regards to the cough that she has had for months, keep follow-up appointment with pulmonology. Final Clinical Impressions(s) / UC Diagnoses   Final diagnoses:  None   Discharge Instructions   None    ED Prescriptions   None    PDMP not reviewed this encounter.   Landis Martins, [place] 09/27/23 [phone]

## 2023-09-30 ENCOUNTER — Other Ambulatory Visit (HOSPITAL_COMMUNITY): Payer: Medicaid Other

## 2023-10-02 ENCOUNTER — Ambulatory Visit: Payer: Medicaid Other | Attending: Cardiovascular Disease | Admitting: Cardiovascular Disease

## 2023-10-02 ENCOUNTER — Encounter: Payer: Self-pay | Admitting: Cardiovascular Disease

## 2023-10-02 VITALS — BP 116/78 | HR 70 | Ht 67.0 in | Wt 206.2 lb

## 2023-10-02 DIAGNOSIS — I340 Nonrheumatic mitral (valve) insufficiency: Secondary | ICD-10-CM

## 2023-10-02 DIAGNOSIS — I471 Supraventricular tachycardia, unspecified: Secondary | ICD-10-CM | POA: Diagnosis not present

## 2023-10-02 DIAGNOSIS — I4729 Other ventricular tachycardia: Secondary | ICD-10-CM | POA: Diagnosis not present

## 2023-10-02 NOTE — Progress Notes (Addendum)
Chief Complaint  Patient presents with   Follow-up    Mitral regurgitation    History of Present Illness: 42 yo female with history of tobacco abuse, mitral valve regurgitation, ADHD and depression who is here today for cardiac follow up. I saw her as a new patient for evaluation of chest pain on 05/20/12. She had been seen in the ED at [facility] on 04/23/12 with c/o chest pain. Her EKG 04/23/12 showed inverted T waves inferior leads, poor R wave progression through the precordial leads, NSR. Troponin negative. I arranged an exercise stress test and an echo. 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 She had no EKG changes c/w ischemia during exercise stress test. I saw her in 2019 for chest pain. She did not complete the coronary CTA that was ordered. She was seen in September 2024 by Dr. Tomie China and c/o chest pain which was felt to be atypical for a cardiac cause and palpitations. She also noted having "black out" spells. Cardiac monitor September 2024 with sinus with several short runs of SVT (longest 6 beats) and narrow complex atrial tachycardia (longest lasting 8 beats). Rare PACs. Echo 08/19/23 with mild with LVEF=60-65%. Mild to moderate MR. Stress echo 09/03/23 with no ischemia. She was seen in the ED 09/22/23 with cough and palpitations. EKG with sinus tachycardia, rate 105 bpm. She was seen in the ED again on 09/27/23 and treated for sinus infection.   She is here today for follow up. She has many questions today. She continues to have periods of tachycardia. She has not used the albuterol inhaler because it has made her heart race. She is wondering if the Zytrec could cause palpitations. She continues to have cough. No fevers or chills. She has no exertional chest pain or dyspnea. Her complaints today are mostly related to her cough and feeling dyspneic at times. Her chest x-ray last week was clear. No  lower extremity edema, dizziness, near syncope or syncope.    Primary Care Physician: Reubin Milan, PA-C  Past Medical History:  Diagnosis Date   ADD (attention deficit disorder) 07/22/2011   ADHD (attention deficit hyperactivity disorder)    Allergic rhinitis 01/23/2011   Formatting of this note might be different from the original. Allergic Rhinitis 10/1 IMO update     Anxiety 10/03/2015   Last Assessment & Plan: Formatting of this note might be different from the original. Increased/worsening anxiety and depression.  Will restart patient on Trintellix as she reports this was a good regimen for her.  Started 10 mg.  Patient was psychiatry follow-up in 4 weeks.  We will follow-up pending evaluation.  Patient about to lose her Medicaid for her reports.  We will follow-up with family s   Asthma    Candidiasis of vagina 01/20/2023   Chest pain 05/20/2012   Depression    Fatigue 10/03/2015   Last Assessment & Plan: Formatting of this note might be different from the original. Patient with increased fatigue.  Chronic problem.  Reports history of iron deficiency anemia.  Concurrent depression.  Will treat patient for depression as well as draw basic labs for further evaluation.  Follow-up pending labs.     Genital herpes 10/16/2009   Formatting of this note might be different from the original. Herpes Simplex Type II 10/1 IMO update Last Assessment & Plan: Formatting of this note might be different from the original. Patient  with past positivity for HSV 1 and 2 serotype. Notes some genital lesions in the past as well. Patient denies any recent symptoms or need for valtrex. Patient is concerned as she is in a new relationship a   Herpes simplex 01/20/2023   Herpesvirus infection 01/20/2023   History of trichomonal vaginitis 12/24/2016   Last Assessment & Plan: Formatting of this note might be different from the original. History of trichomonas- dx in clinic on 12/17/16 by Dr. Earnest Bailey. Completed  treatment with Flagyl 12/20/16. Pt requesting repeat testing today. Per patient, partner was treated for trichomonas after testing positive. Recheck today to assure no reinfection. Pt verbalizes understanding for current treatment plan.     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 Depression     Postcoital bleeding 01/20/2023   Sexually transmitted disease 01/20/2023   Vaginal discharge 01/20/2023   Vitamin D deficiency 10/15/2010   Formatting of this note might be different from the original. Vitamin D Deficiency 10/1 IMO update      Past Surgical History:  Procedure Laterality Date   CYSTECTOMY     Neck   Pilonidal cyst removal     Tubal implant procedure      Current Outpatient Medications  Medication Sig Dispense Refill   benzonatate (TESSALON) 200 MG capsule Take 1 capsule (200 mg total) by mouth 3 (three) times daily as needed for cough. 60 capsule 1   cetirizine (ZYRTEC ALLERGY) 10 MG tablet Take 1 tablet (10 mg total) by mouth daily. 30 tablet 0   citalopram (CELEXA) 20 MG tablet Take 1 tablet (20 mg total) by mouth daily. 30 tablet 6   fluticasone (FLONASE) 50 MCG/ACT nasal spray Place 1 spray into both nostrils daily.     fluticasone (FLOVENT HFA) 220 MCG/ACT inhaler Inhale 2 puffs into the lungs every 12 (twelve) hours. 1 each 5   metoprolol succinate (TOPROL XL) 25 MG 24 hr tablet Take 1 tablet (25 mg total) by mouth daily. 90 tablet 3   VENTOLIN HFA 108 (90 Base) MCG/ACT inhaler Inhale 1-2 puffs into the lungs every 4 (four) hours as needed. 18 g 2   No current facility-administered medications for this visit.    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  Tobacco Use   Smoking status: Former    Current packs/day: 0.00    Average packs/day: 0.2 packs/day for 10.0 years (2.0 ttl pk-yrs)    Types: Cigarettes    Start date: 2013    Quit date: 2023    Years since quitting: 1.9   Smokeless tobacco: Never  Vaping Use   Vaping status: Never Used  Substance and Sexual Activity   Alcohol use: Not Currently    Comment: socially, not even one drink a week   Drug use: No   Sexual activity: Yes    Birth control/protection: Other-see comments  Other Topics Concern   Not on file  Social History Narrative   Caffeine: none   Social Determinants of Health   Financial Resource Strain: Low Risk  (07/22/2023)   Received from [facility]   Overall Financial Resource Strain (CARDIA)    Difficulty of Paying Living Expenses: Not hard at all  Food Insecurity: No Food Insecurity (07/22/2023)   Received from [facility]   Hunger Vital Sign    Worried About Running Out of Food in the Last Year: Never true    Ran Out of Food in the Last Year: Never true  Transportation Needs: Unmet Transportation Needs (07/22/2023)   Received from [facility] - Transportation    Lack of Transportation (Medical): No    Lack of Transportation (Non-Medical): Yes  Physical Activity: Unknown (07/22/2023)   [facility] from [facility]   Exercise Vital Sign    Days of Exercise per Week: Patient declined    Minutes of Exercise per Session: Not on file  Stress: No Stress Concern Present (07/22/2023)   Received from [facility] - Occupational Stress Questionnaire    Feeling of Stress : Not at all  Social Connections:  Moderately Integrated (07/22/2023)   Received from [facility]   Social Network    How would you rate your social network (family, work, friends)?: Adequate participation with social networks  Intimate Partner Violence: Not At Risk (07/22/2023)   Received from [facility]   HITS    Over the last 12 months how often did your partner physically hurt you?: Never    Over the last 12 months how often did your partner insult you or talk down to you?: Never    Over the last 12 months how often did your partner threaten you with physical harm?: Never    Over the last 12 months how often did your partner scream or curse at you?: Never    Family History  Problem Relation Age of Onset   COPD Mother    Hypertension Mother    Glaucoma Father    High Cholesterol Father    Breast cancer Maternal Grandmother    Heart attack Maternal Grandmother    COPD Maternal Grandmother    Heart disease Maternal Grandfather    COPD Maternal Grandfather     Review of Systems:  As stated in the HPI and otherwise negative.   BP 116/78   Pulse 70   Ht 5\' 7"  (1.702 m)   Wt 93.5 kg   LMP 09/26/2023 (Exact Date)   SpO2 97%   BMI 32.30 kg/m   Physical Examination: General: Well developed, well nourished, NAD  HEENT: OP clear, mucus membranes moist  SKIN: warm, dry. No rashes. Neuro: No focal deficits  Musculoskeletal: Muscle strength 5/5 all ext  Psychiatric: Mood and affect normal  Neck: No JVD, no carotid bruits, no thyromegaly, no lymphadenopathy.  Lungs:Clear bilaterally, no wheezes, rhonci, crackles Cardiovascular: Regular rate and rhythm. Soft systolic murmur.  Abdomen:Soft. Bowel sounds present. Non-tender.  Extremities: No lower extremity edema. Pulses are 2 + in the bilateral DP/PT.  EKG:  EKG is not ordered today. The EKG shows   Recent Labs: 07/24/2023: ALT 10; TSH 0.922 08/26/2023: Magnesium 2.2 09/22/2023: BUN 12; Creatinine, Ser 0.78; Hemoglobin 12.1; Platelets 396; Potassium 3.6;  Sodium 135   Lipid Panel    Component Value Date/Time   CHOL 158 07/24/2023 1321   TRIG 89 07/24/2023 1321   HDL 55  07/24/2023 1321   CHOLHDL 2.9 07/24/2023 1321   LDLCALC 86 07/24/2023 1321     Wt Readings from Last 3 Encounters:  10/02/23 93.5 kg  09/22/23 86.2 kg  09/08/23 91.4 kg     Assessment and Plan:   1. Mitral valve regurgitation: Mild to moderate by echo October 2024. Will repeat echo in one year   2. SVT/Atrial tachycardia/PACs: Continue Toprol. Normal LV function by echo in October 2024 with normal stress echo in November 2024.   3. Persistent cough: She has follow up in primary care tomorrow.   Labs/ tests ordered today include:  No orders of the defined types were placed in this encounter.  Disposition:   F/U with me  in 6 months    Signed, Verne Carrow, MD 10/02/2023 12:17 PM    [facility] [address] [place], [place], [place]  [address] Phone: [phone]; Fax: [phone]

## 2023-10-02 NOTE — Patient Instructions (Signed)
Medication Instructions:  No changes *If you need a refill on your cardiac medications before your next appointment, please call your pharmacy*   Lab Work: none If you have labs (blood work) drawn today and your tests are completely normal, you will receive your results only by: MyChart Message (if you have MyChart) OR A paper copy in the mail If you have any lab test that is abnormal or we need to change your treatment, we will call you to review the results.   Testing/Procedures: none   Follow-Up: At [facility], you and your health needs are our priority.  As part of our continuing mission to provide you with exceptional heart care, we have created designated Provider Care Teams.  These Care Teams include your primary Cardiologist (physician) and Advanced Practice Providers (APPs -  Physician Assistants and Nurse Practitioners) who all work together to provide you with the care you need, when you need it.   Your next appointment:   6 month(s)  Provider:   Christopher McAlhany, MD      

## 2023-10-07 ENCOUNTER — Encounter: Payer: Self-pay | Admitting: Cardiovascular Disease

## 2023-10-15 ENCOUNTER — Ambulatory Visit: Payer: Medicaid Other | Admitting: Cardiology

## 2023-10-16 ENCOUNTER — Telehealth: Payer: Self-pay | Admitting: Cardiovascular Disease

## 2023-10-16 NOTE — Telephone Encounter (Signed)
Caller (Pam stated) she is following-up on the status of patient's clearance.

## 2023-10-17 ENCOUNTER — Institutional Professional Consult (permissible substitution): Payer: Medicaid Other | Admitting: Neurology

## 2023-10-24 ENCOUNTER — Telehealth: Payer: Medicaid Other | Admitting: Pulmonary Disease

## 2023-10-24 DIAGNOSIS — J4540 Moderate persistent asthma, uncomplicated: Secondary | ICD-10-CM | POA: Diagnosis not present

## 2023-10-24 DIAGNOSIS — J45909 Unspecified asthma, uncomplicated: Secondary | ICD-10-CM

## 2023-10-24 NOTE — Patient Instructions (Signed)
Use Symbicort as needed  Use albuterol as needed as well  Graded activities as tolerated  Tentative follow-up in 6 months  Call us with significant concerns

## 2023-10-24 NOTE — Progress Notes (Signed)
Virtual Visit via Video Note  I connected with Natalie Colon on 10/24/23 at  2:00 PM EST by a video enabled telemedicine application and verified that I am speaking with the correct person using two identifiers.  Location: Patient:in her vehicle Provider: In the office, [address].   I discussed the limitations of evaluation and management by telemedicine and the availability of in person appointments. The patient expressed understanding and agreed to proceed.  History of Present Illness: Patient being followed up for asthma  Symptoms are better controlled  Heart rate is better controlled as well  She is on metoprolol which seems to be helping her heart rate  Has been using Symbicort which was recently changed as needed  May have symptoms in the evening sometimes when she is trying to lay down  Has not been avoiding activities because of shortness of breath   Observations/Objective: She looks well, comfortable  Assessment and Plan: Moderate persistent asthma with stable symptoms at the present time  Encouraged to continue using inhalers as needed  Graded activities as tolerated  Follow Up Instructions: Call us with significant concerns  Tentative follow-up in about 6 months   I discussed the assessment and treatment plan with the patient. The patient was provided an opportunity to ask questions and all were answered. The patient agreed with the plan and demonstrated an understanding of the instructions.   The patient was advised to call back or seek an in-person evaluation if the symptoms worsen or if the condition fails to improve as anticipated.  I provided 20 minutes of non-face-to-face time during this encounter.   Tomma Lightning, MD

## 2023-10-30 ENCOUNTER — Ambulatory Visit: Payer: Medicaid Other | Admitting: Cardiology

## 2023-11-25 ENCOUNTER — Ambulatory Visit: Payer: Medicaid Other | Admitting: Pulmonary Disease

## 2023-11-26 ENCOUNTER — Ambulatory Visit
Admission: EM | Admit: 2023-11-26 | Discharge: 2023-11-26 | Disposition: A | Discharge status: Home / Self Care | Payer: Medicaid Other | Attending: Family Medicine | Admitting: Family Medicine

## 2023-11-26 DIAGNOSIS — N3001 Acute cystitis with hematuria: Secondary | ICD-10-CM | POA: Insufficient documentation

## 2023-11-26 DIAGNOSIS — Z113 Encounter for screening for infections with a predominantly sexual mode of transmission: Secondary | ICD-10-CM | POA: Diagnosis present

## 2023-11-26 LAB — POCT URINALYSIS DIP (MANUAL ENTRY)
Bilirubin, UA: NEGATIVE
Glucose, UA: NEGATIVE mg/dL
Nitrite, UA: NEGATIVE
Protein Ur, POC: 30 mg/dL — AB
Spec Grav, UA: >=1.03 — AB (ref 1.010–1.025)
Urobilinogen, UA: 0.2 U/dL
pH, UA: 6 (ref 5.0–8.0)

## 2023-11-26 LAB — POCT URINE PREGNANCY: Preg Test, Ur: NEGATIVE

## 2023-11-26 MED ORDER — NITROFURANTOIN MONOHYD MACRO 100 MG PO CAPS: 100.0000 mg | ORAL_CAPSULE | Freq: Two times a day (BID) | ORAL | 0 refills | Status: AC

## 2023-11-26 NOTE — ED Provider Notes (Signed)
[facility]    CSN: [id number] Arrival date & time: 11/26/23  1819      History   Chief Complaint No chief complaint on file.   HPI Natalie Colon is a 43 y.o. female presents for urinary symptoms.  Patient reports 1 day of urinary urgency/frequency without dysuria.  States her last menstrual cycle was 1/17 and she has been having some spotting since yesterday as well.  States she is on birth control and denies history of irregular periods.  States she had some regular vaginal discharge that was just more than her normal but was not itchy or smelly.  No fevers, nausea/vomiting, flank pain.  No known STD exposure but she would like screening.  No other concerns at this time.  HPI  Past Medical History:  Diagnosis Date   ADD (attention deficit disorder) 07/22/2011   ADHD (attention deficit hyperactivity disorder)    Allergic rhinitis 01/23/2011   Formatting of this note might be different from the original. Allergic Rhinitis 10/1 IMO update     Anxiety 10/03/2015   Last Assessment & Plan: Formatting of this note might be different from the original. Increased/worsening anxiety and depression.  Will restart patient on Trintellix as she reports this was a good regimen for her.  Started 10 mg.  Patient was psychiatry follow-up in 4 weeks.  We will follow-up pending evaluation.  Patient about to lose her Medicaid for her reports.  We will follow-up with family s   Asthma    Candidiasis of vagina 01/20/2023   Chest pain 05/20/2012   Depression    Fatigue 10/03/2015   Last Assessment & Plan: Formatting of this note might be different from the original. Patient with increased fatigue.  Chronic problem.  Reports history of iron deficiency anemia.  Concurrent depression.  Will treat patient for depression as well as draw basic labs for further evaluation.  Follow-up pending labs.     Genital herpes 10/16/2009   Formatting of this note might be different from the original. Herpes  Simplex Type II 10/1 IMO update Last Assessment & Plan: Formatting of this note might be different from the original. Patient with past positivity for HSV 1 and 2 serotype. Notes some genital lesions in the past as well. Patient denies any recent symptoms or need for valtrex. Patient is concerned as she is in a new relationship a   Herpes simplex 01/20/2023   Herpesvirus infection 01/20/2023   History of trichomonal vaginitis 12/24/2016   Last Assessment & Plan: Formatting of this note might be different from the original. History of trichomonas- dx in clinic on 12/17/16 by Dr. Earnest Bailey. Completed treatment with Flagyl 12/20/16. Pt requesting repeat testing today. Per patient, partner was treated for trichomonas after testing positive. Recheck today to assure no reinfection. Pt verbalizes understanding for current treatment plan.     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 Depression     Postcoital bleeding 01/20/2023   Sexually transmitted disease 01/20/2023   Vaginal discharge 01/20/2023   Vitamin D deficiency 10/15/2010   Formatting of this note might be different from the original. Vitamin D Deficiency 10/1 IMO update      Patient Active Problem List   Diagnosis Date Noted   Cardiac murmur 07/23/2023   Snoring 07/23/2023   Obesity (BMI 30.0-34.9) 07/23/2023   Xanthelasma  of eyelid 07/23/2023   Palpitations 07/23/2023   ADHD (attention deficit hyperactivity disorder)    Depression    Candidiasis of vagina 01/20/2023   Herpes simplex 01/20/2023   Herpesvirus infection 01/20/2023   Postcoital bleeding 01/20/2023   Sexually transmitted disease 01/20/2023   Vaginal discharge 01/20/2023   History of trichomonal vaginitis 12/24/2016   Anxiety 10/03/2015   Fatigue 10/03/2015   Chest pain 05/20/2012   ADD (attention deficit  disorder) 07/22/2011   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      Home Medications    Prior to Admission medications   Medication Sig Start Date End Date Taking? Authorizing Provider  cetirizine (ZYRTEC ALLERGY) 10 MG tablet Take 1 tablet (10 mg total) by mouth daily. 07/11/23  Yes Wallis Bamberg, PA-C  metoprolol succinate (TOPROL XL) 25 MG 24 hr tablet Take 1 tablet (25 mg total) by mouth daily. 09/24/23  Yes Revankar, Aundra Dubin, MD  nitrofurantoin, macrocrystal-monohydrate, (MACROBID) 100 MG capsule Take 1 capsule (100 mg total) by mouth 2 (two) times daily for 7 days. 11/26/23 12/03/23 Yes Radford Pax, NP  benzonatate (TESSALON) 200 MG capsule Take 1 capsule (200 mg total) by mouth 3 (three) times daily as needed for cough. 08/21/23   Olalere, Onnie Boer A, MD  citalopram (CELEXA) 20 MG tablet Take 1 tablet (20 mg total) by mouth daily. 09/08/23 04/05/24  Windell Norfolk, MD  fluticasone (FLONASE) 50 MCG/ACT nasal spray Place 1 spray into both nostrils daily. 07/22/23   [provider]  fluticasone (FLOVENT HFA) 220 MCG/ACT inhaler Inhale 2 puffs into the lungs every 12 (twelve) hours. 08/21/23   Olalere, Minna Antis, MD  VENTOLIN HFA 108 (90 Base) MCG/ACT inhaler Inhale 1-2 puffs into the lungs every 4 (four) hours as needed. 08/21/23   Tomma Lightning, MD    Family History Family History  Problem Relation Age of Onset   COPD Mother    Hypertension Mother    Glaucoma Father    High Cholesterol Father    Breast cancer Maternal Grandmother    Heart attack Maternal Grandmother    COPD Maternal Grandmother    Heart  disease Maternal Grandfather    COPD Maternal Grandfather     Social History Social History   Tobacco Use   Smoking status: Former    Current packs/day: 0.00    Average packs/day: 0.2 packs/day for 10.0 years (2.0 ttl pk-yrs)    Types: Cigarettes    Start date: 2013    Quit date: 2023    Years since quitting: 2.0   Smokeless tobacco: Never  Vaping Use   Vaping status: Never Used  Substance Use Topics   Alcohol use: Not Currently    Comment: socially, not even one drink a week   Drug use: No     Allergies   Latex, Penicillins, Vicodin [hydrocodone-acetaminophen], and Hydrocodone-acetaminophen   Review of Systems Review of Systems  Genitourinary:  Positive for frequency, urgency and vaginal bleeding.     Physical Exam Triage Vital Signs ED Triage Vitals  Encounter Vitals Group  BP 11/26/23 1927 134/88     Systolic BP Percentile --      Diastolic BP Percentile --      Pulse Rate 11/26/23 1927 68     Resp 11/26/23 1927 16     Temp 11/26/23 1927 98.6 F (37 C)     Temp Source 11/26/23 1927 Oral     SpO2 11/26/23 1927 98 %     Weight --      Height --      Head Circumference --      Peak Flow --      Pain Score 11/26/23 1925 0     Pain Loc --      Pain Education --      Exclude from Growth Chart --    No data found.  Updated Vital Signs BP 134/88 (BP Location: Left Arm)   Pulse 68   Temp 98.6 F (37 C) (Oral)   Resp 16   SpO2 98%   Visual Acuity Right Eye Distance:   Left Eye Distance:   Bilateral Distance:    Right Eye Near:   Left Eye Near:    Bilateral Near:     Physical Exam Vitals and nursing note reviewed.  Constitutional:      Appearance: Normal appearance.  HENT:     Head: Normocephalic and atraumatic.  Eyes:     Pupils: Pupils are equal, round, and reactive to light.  Cardiovascular:     Rate and Rhythm: Normal rate.  Pulmonary:     Effort: Pulmonary effort is normal.  Abdominal:     Tenderness: There is no right CVA  tenderness or left CVA tenderness.  Skin:    General: Skin is warm and dry.  Neurological:     General: No focal deficit present.     Mental Status: She is alert and oriented to person, place, and time.  Psychiatric:        Mood and Affect: Mood normal.        Behavior: Behavior normal.      UC Treatments / Results  Labs (all labs ordered are listed, but only abnormal results are displayed) Labs Reviewed  POCT URINALYSIS DIP (MANUAL ENTRY) - Abnormal; Notable for the following components:      Result Value   Color, UA straw (*)    Clarity, UA cloudy (*)    Ketones, POC UA moderate (40) (*)    Spec Grav, UA >=1.030 (*)    Blood, UA large (*)    Protein Ur, POC =30 (*)    Leukocytes, UA Trace (*)    All other components within normal limits  URINE CULTURE  RPR  HIV ANTIBODY (ROUTINE TESTING W REFLEX)  POCT URINE PREGNANCY  CERVICOVAGINAL ANCILLARY ONLY    EKG   Radiology No results found.  Procedures Procedures (including critical care time)  Medications Ordered in UC Medications - No data to display  Initial Impression / Assessment and Plan / UC Course  I have reviewed the triage vital signs and the nursing notes.  Pertinent labs & imaging results that were available during my care of the patient were reviewed by me and considered in my medical decision making (see chart for details).     Reviewed exam and symptoms with patient.  No red flags.  Urine positive for UTI, will culture and start Macrobid.  Negative urine hCG.  Vaginal swab/STD testing is ordered we will contact for any positive results.  Further treatment pending these results.  Encouraged fluids and rest.  Advised GYN follow-up if symptoms do not improve.  ER precautions reviewed. Final Clinical Impressions(s) / UC Diagnoses   Final diagnoses:  Screening examination for STD (sexually transmitted disease)  Acute cystitis with hematuria     Discharge Instructions      The clinical contact you  with results of the testing done today if positive.  Start Macrobid twice daily for 7 days.  Lots of rest and fluids.  Please follow-up with your PCP if your symptoms do not improve.  Please go to the ER for any worsening symptoms.  I hope you feel better soon!     ED Prescriptions     Medication Sig Dispense Auth. Provider   nitrofurantoin, macrocrystal-monohydrate, (MACROBID) 100 MG capsule Take 1 capsule (100 mg total) by mouth 2 (two) times daily for 7 days. 14 capsule Radford Pax, NP      PDMP not reviewed this encounter.   Radford Pax, NP 11/26/23 [phone]

## 2023-11-26 NOTE — ED Triage Notes (Signed)
Pt presents to UC for c/o vaginal discharge since yesterday and urinary urgency. Pt reports pain and burning after sexual intercourse and spotting after her period.

## 2023-11-26 NOTE — Discharge Instructions (Addendum)
The clinical contact you with results of the testing done today if positive.  Start Macrobid twice daily for 7 days.  Lots of rest and fluids.  Please follow-up with your PCP if your symptoms do not improve.  Please go to the ER for any worsening symptoms.  I hope you feel better soon!

## 2023-11-27 ENCOUNTER — Telehealth (HOSPITAL_COMMUNITY): Payer: Self-pay

## 2023-11-27 LAB — CERVICOVAGINAL ANCILLARY ONLY
Bacterial Vaginitis (gardnerella): POSITIVE — AB
Candida Glabrata: NEGATIVE
Candida Vaginitis: NEGATIVE
Chlamydia: NEGATIVE
Comment: NEGATIVE
Comment: NEGATIVE
Comment: NEGATIVE
Comment: NEGATIVE
Comment: NEGATIVE
Comment: NORMAL
Neisseria Gonorrhea: NEGATIVE
Trichomonas: POSITIVE — AB

## 2023-11-27 MED ORDER — METRONIDAZOLE 500 MG PO TABS: 500.0000 mg | ORAL_TABLET | Freq: Two times a day (BID) | ORAL | 0 refills | Status: AC

## 2023-11-27 NOTE — Telephone Encounter (Signed)
Per protocol, pt requires tx with metronidazole. Reviewed with patient, verified pharmacy, prescription sent.

## 2023-11-28 LAB — HIV ANTIBODY (ROUTINE TESTING W REFLEX): HIV Screen 4th Generation wRfx: NONREACTIVE

## 2023-11-28 LAB — URINE CULTURE: Culture: NO GROWTH

## 2023-11-28 LAB — RPR: RPR Ser Ql: NONREACTIVE

## 2024-01-13 ENCOUNTER — Telehealth: Payer: Self-pay | Admitting: Cardiovascular Disease

## 2024-01-13 ENCOUNTER — Encounter: Payer: Self-pay | Admitting: Neurology

## 2024-01-13 NOTE — Telephone Encounter (Signed)
 Spoke with patient and she states her HR has been elevated and she is only sitting down. She also states she has also been having dizzy spells. She states she feels since she started back working she feels her symptoms are more frequent. HR currently 58. She states provider did advise she can take an extra dose of metoprolol if her HR is elevated but she can not remember the the parameters he gave her. She would like to know the parameters. Currently asymptomatic. ED precautions discussed.  From patient's Apple Watch today: 70 bpm - 4:55 pm 102 bpm - 4:33 pm 111 bpm - 4:31 pm 82-93 bpm - 4:21 pm 87-105 bpm - 4:19 pm

## 2024-01-13 NOTE — Telephone Encounter (Signed)
 STAT if HR is under 50 or over 120 (normal HR is 60-100 beats per minute)  What is your heart rate?  107 bpm  Do you have a log of your heart rate readings (document readings)?   From patient's Apple Watch today: 70 bpm - 4:55 pm 102 bpm - 4:33 pm 111 bpm - 4:31 pm 82-93 bpm - 4:21 pm 87-105 bpm - 4:19 pm  Do you have any other symptoms?   Patient noted she has felt weak and stumbled a couple of times last week   Patient stated she recently started back working and as she has been moving around a lot more her heart rate has been increasing.  Patient stated she started feeling tightness in her chest when her HR started increasing.  Patient stated she wants to know when she should take additional metoprolol succinate (TOPROL XL) 25 MG 24 hr tablet.

## 2024-01-14 NOTE — Telephone Encounter (Signed)
 Natalie Hazel, MD  P [facility] Triage; Lendon Ka, RN Caller: Unspecified (Yesterday,  4:56 PM) I would not take extra Toprol with heart rates 70-110 bpm. This has been sinus. She did have a few beats of SVT on her monitor but her heart rate would be over 150 bpm if she was having long runs of SVT. Continue to hydrate aggressively. No other recs. Thayer Ohm   Returned a call back to the pt and endorsed Dr. Gibson Ramp recommendations as mentioned above.   Advised her to aggressively hydrate and start wearing compressions during the day to assist with dizziness, circulation, and any orthostasis symptoms if she experiences them.  Pt education provided about concerning HR parameters that she should call the office about, to inform us of this.   Pt verbalized understanding and agrees with this plan.

## 2024-02-07 ENCOUNTER — Ambulatory Visit
Admission: EM | Admit: 2024-02-07 | Discharge: 2024-02-07 | Disposition: A | Discharge status: Home / Self Care | Attending: Family Medicine | Admitting: Family Medicine

## 2024-02-07 DIAGNOSIS — B9789 Other viral agents as the cause of diseases classified elsewhere: Secondary | ICD-10-CM

## 2024-02-07 DIAGNOSIS — J988 Other specified respiratory disorders: Secondary | ICD-10-CM

## 2024-02-07 DIAGNOSIS — J4540 Moderate persistent asthma, uncomplicated: Secondary | ICD-10-CM

## 2024-02-07 DIAGNOSIS — J309 Allergic rhinitis, unspecified: Secondary | ICD-10-CM

## 2024-02-07 LAB — POC COVID19/FLU A&B COMBO
Covid Antigen, POC: NEGATIVE
Influenza A Antigen, POC: NEGATIVE
Influenza B Antigen, POC: NEGATIVE

## 2024-02-07 MED ORDER — PROMETHAZINE-DM 6.25-15 MG/5ML PO SYRP: 5.0000 mL | ORAL_SOLUTION | Freq: Three times a day (TID) | ORAL | 0 refills | Status: DC | PRN

## 2024-02-07 MED ORDER — PREDNISONE 20 MG PO TABS: ORAL_TABLET | ORAL | 0 refills | Status: DC

## 2024-02-07 NOTE — ED Provider Notes (Signed)
 [facility]  Note:  This document was prepared using Conservation officer, historic buildings and may include unintentional dictation errors.  MRN: [id number] DOB: 07-02-1981  Subjective:   Natalie Colon is a 43 y.o. female presenting for 3-day history of sinus congestion, throat pain, drainage, body pains, malaise and fatigue. No albuterol, takes Symbicort. Takes cetirizine for allergies.  Reports that she was on a long-term steroid course that she finished about a month ago for her asthma and respiratory symptoms secondary to COVID infection. No smoking of any kind including cigarettes, cigars, vaping, marijuana use.    No current facility-administered medications for this encounter.  Current Outpatient Medications:    benzonatate (TESSALON) 200 MG capsule, Take 1 capsule (200 mg total) by mouth 3 (three) times daily as needed for cough., Disp: 60 capsule, Rfl: 1   cetirizine (ZYRTEC ALLERGY) 10 MG tablet, Take 1 tablet (10 mg total) by mouth daily., Disp: 30 tablet, Rfl: 0   citalopram (CELEXA) 20 MG tablet, Take 1 tablet (20 mg total) by mouth daily., Disp: 30 tablet, Rfl: 6   fluticasone (FLONASE) 50 MCG/ACT nasal spray, Place 1 spray into both nostrils daily., Disp: , Rfl:    fluticasone (FLOVENT HFA) 220 MCG/ACT inhaler, Inhale 2 puffs into the lungs every 12 (twelve) hours., Disp: 1 each, Rfl: 5   metoprolol succinate (TOPROL XL) 25 MG 24 hr tablet, Take 1 tablet (25 mg total) by mouth daily., Disp: 90 tablet, Rfl: 3   VENTOLIN HFA 108 (90 Base) MCG/ACT inhaler, Inhale 1-2 puffs into the lungs every 4 (four) hours as needed., Disp: 18 g, Rfl: 2   Allergies  Allergen Reactions   Latex Itching   Penicillins Other (See Comments)    unknown   Vicodin [Hydrocodone-Acetaminophen] Other (See Comments)    Heart racing   Hydrocodone-Acetaminophen Palpitations    Past Medical History:  Diagnosis Date   ADD (attention deficit disorder) 07/22/2011   ADHD (attention  deficit hyperactivity disorder)    Allergic rhinitis 01/23/2011   Formatting of this note might be different from the original. Allergic Rhinitis 10/1 IMO update     Anxiety 10/03/2015   Last Assessment & Plan: Formatting of this note might be different from the original. Increased/worsening anxiety and depression.  Will restart patient on Trintellix as she reports this was a good regimen for her.  Started 10 mg.  Patient was psychiatry follow-up in 4 weeks.  We will follow-up pending evaluation.  Patient about to lose her Medicaid for her reports.  We will follow-up with family s   Asthma    Candidiasis of vagina 01/20/2023   Chest pain 05/20/2012   Depression    Fatigue 10/03/2015   Last Assessment & Plan: Formatting of this note might be different from the original. Patient with increased fatigue.  Chronic problem.  Reports history of iron deficiency anemia.  Concurrent depression.  Will treat patient for depression as well as draw basic labs for further evaluation.  Follow-up pending labs.     Genital herpes 10/16/2009   Formatting of this note might be different from the original. Herpes Simplex Type II 10/1 IMO update Last Assessment & Plan: Formatting of this note might be different from the original. Patient with past positivity for HSV 1 and 2 serotype. Notes some genital lesions in the past as well. Patient denies any recent symptoms or need for valtrex. Patient is concerned as she is in a new relationship a   Herpes simplex 01/20/2023  Herpesvirus infection 01/20/2023   History of trichomonal vaginitis 12/24/2016   Last Assessment & Plan: Formatting of this note might be different from the original. History of trichomonas- dx in clinic on 12/17/16 by Dr. Leamon Pringle. Completed treatment with Flagyl 12/20/16. Pt requesting repeat testing today. Per patient, partner was treated for trichomonas after testing positive. Recheck today to assure no reinfection. Pt verbalizes understanding for current  treatment plan.     Mitral valve prolapse 10/28/2008   Echo and workup in [place]    Moderate episode of recurrent major depressive disorder (HCC) 02/27/2011   Formatting of this note might be different from the original. Depression     Postcoital bleeding 01/20/2023   Sexually transmitted disease 01/20/2023   Vaginal discharge 01/20/2023   Vitamin D deficiency 10/15/2010   Formatting of this note might be different from the original. Vitamin D Deficiency 10/1 IMO update       Past Surgical History:  Procedure Laterality Date   CYSTECTOMY     Neck   Pilonidal cyst removal     Tubal implant procedure      Family History  Problem Relation Age of Onset   COPD Mother    Hypertension Mother    Glaucoma Father    High Cholesterol Father    Breast cancer Maternal Grandmother    Heart attack Maternal Grandmother    COPD Maternal Grandmother    Heart disease Maternal Grandfather    COPD Maternal Grandfather     Social History   Tobacco Use   Smoking status: Former    Current packs/day: 0.00    Average packs/day: 0.2 packs/day for 10.0 years (2.0 ttl pk-yrs)    Types: Cigarettes    Start date: 2013    Quit date: 2023    Years since quitting: 2.2   Smokeless tobacco: Never  Vaping Use   Vaping status: Never Used  Substance Use Topics   Alcohol use: Not Currently   Drug use: No    ROS   Objective:   Vitals: BP (!) 127/90 (BP Location: Left Arm)   Pulse 73   Temp 98.9 F (37.2 C) (Oral)   Resp 20   SpO2 97%   Physical Exam Constitutional:      General: She is not in acute distress.    Appearance: Normal appearance. She is well-developed and normal weight. She is not ill-appearing, toxic-appearing or diaphoretic.  HENT:     Head: Normocephalic and atraumatic.     Right Ear: Tympanic membrane, ear canal and external ear normal. No drainage or tenderness. No middle ear effusion. There is no impacted cerumen. Tympanic membrane is not erythematous or bulging.      Left Ear: Tympanic membrane, ear canal and external ear normal. No drainage or tenderness.  No middle ear effusion. There is no impacted cerumen. Tympanic membrane is not erythematous or bulging.     Nose: Nose normal. No congestion or rhinorrhea.     Mouth/Throat:     Mouth: Mucous membranes are moist. No oral lesions.     Pharynx: No pharyngeal swelling, oropharyngeal exudate, posterior oropharyngeal erythema or uvula swelling.     Tonsils: No tonsillar exudate or tonsillar abscesses.  Eyes:     General: No scleral icterus.       Right eye: No discharge.        Left eye: No discharge.     Extraocular Movements: Extraocular movements intact.     Right eye: Normal extraocular motion.  Left eye: Normal extraocular motion.     Conjunctiva/sclera: Conjunctivae normal.  Cardiovascular:     Rate and Rhythm: Normal rate and regular rhythm.     Heart sounds: Normal heart sounds. No murmur heard.    No friction rub. No gallop.  Pulmonary:     Effort: Pulmonary effort is normal. No respiratory distress.     Breath sounds: No stridor. No wheezing, rhonchi or rales.  Chest:     Chest wall: No tenderness.  Musculoskeletal:     Cervical back: Normal range of motion and neck supple.  Lymphadenopathy:     Cervical: No cervical adenopathy.  Skin:    General: Skin is warm and dry.  Neurological:     General: No focal deficit present.     Mental Status: She is alert and oriented to person, place, and time.  Psychiatric:        Mood and Affect: Mood normal.        Behavior: Behavior normal.     Results for orders placed or performed during the hospital encounter of 02/07/24 (from the past 24 hours)  POC Covid19/Flu A&B Antigen     Status: None   Collection Time: 02/07/24 12:39 PM  Result Value Ref Range   Influenza A Antigen, POC Negative Negative   Influenza B Antigen, POC Negative Negative   Covid Antigen, POC Negative Negative    Assessment and Plan :   PDMP not reviewed this  encounter.  1. Viral respiratory infection   2. Allergic rhinitis, unspecified seasonality, unspecified trigger   3. Moderate persistent asthma, uncomplicated    Suspect an exacerbation of her allergic rhinitis and asthma secondary to a viral respiratory infection.  Recommended prednisone and supportive care.  Maintain chronic medications.  Counseled patient on potential for adverse effects with medications prescribed/recommended today, ER and return-to-clinic precautions discussed, patient verbalized understanding.    Adolph Hoop, [place] 02/07/24 1537

## 2024-02-07 NOTE — Discharge Instructions (Addendum)
 We will manage this as a viral respiratory infection, viral illness. For sore throat or cough try using a honey-based tea. Use 3 teaspoons of honey with juice squeezed from half lemon. Place shaved pieces of ginger into 1/2-1 cup of water and warm over stove top. Then mix the ingredients and repeat every 4 hours as needed. Please take Tylenol 500mg -650mg  once every 6 hours for fevers, aches and pains. Hydrate very well with at least 2 liters (64 ounces) of water. Eat light meals such as soups (chicken and noodles, chicken wild rice, vegetable).  Do not eat any foods that you are allergic to.  Start an antihistamine like Zyrtec (10mg  daily) for postnasal drainage, sinus congestion.  You can take this together with prednisone for 5 days.  This is a medication that can help you with your upper respiratory symptoms, sinus symptoms, give you a general respiratory boost.

## 2024-02-07 NOTE — ED Triage Notes (Signed)
 Pt c/o nasal congestion, sore throat, body aches x 3 days-NAD-steady gait

## 2024-02-20 ENCOUNTER — Telehealth: Payer: Self-pay | Admitting: Cardiovascular Disease

## 2024-02-20 NOTE — Telephone Encounter (Signed)
 HR jumping up quickly to over 100 highest has been 130s.  Just walking room to room or changing a diaper or picking up a child.  Recent respiratory infection.   3 days ago, vision went completely dark and then came right back again.  She was dizzy that day and felt off.   120/87  Apple watch has been alerting her of overload - it identifies too much stress.    I adv her to reach out to neuro re: the vision change/dizziness.  She said Dr. Abel Hoe asked her to call back if the HR goes over 110.   Lowest is 86 but jumps up quickly w any movement.  Scheduled her w Margurette Shillings, NP on 4/29.

## 2024-02-20 NOTE — Telephone Encounter (Signed)
 STAT if HR is under 50 or over 120 (normal HR is 60-100 beats per minute)  What is your heart rate? 112  Do you have a log of your heart rate readings (document readings)? 89, 133  Do you have any other symptoms? Sweating,  feeling really hot

## 2024-02-24 ENCOUNTER — Other Ambulatory Visit (HOSPITAL_BASED_OUTPATIENT_CLINIC_OR_DEPARTMENT_OTHER)

## 2024-02-24 ENCOUNTER — Telehealth: Payer: Self-pay | Admitting: Neurology

## 2024-02-24 ENCOUNTER — Ambulatory Visit (HOSPITAL_BASED_OUTPATIENT_CLINIC_OR_DEPARTMENT_OTHER): Admitting: Family

## 2024-02-24 ENCOUNTER — Encounter (HOSPITAL_BASED_OUTPATIENT_CLINIC_OR_DEPARTMENT_OTHER): Payer: Self-pay | Admitting: Family

## 2024-02-24 VITALS — BP 112/66 | HR 71 | Ht 67.0 in | Wt 218.0 lb

## 2024-02-24 DIAGNOSIS — R002 Palpitations: Secondary | ICD-10-CM

## 2024-02-24 DIAGNOSIS — I340 Nonrheumatic mitral (valve) insufficiency: Secondary | ICD-10-CM | POA: Diagnosis not present

## 2024-02-24 DIAGNOSIS — I471 Supraventricular tachycardia, unspecified: Secondary | ICD-10-CM

## 2024-02-24 MED ORDER — METOPROLOL SUCCINATE ER 25 MG PO TB24: 37.5000 mg | ORAL_TABLET | Freq: Every day | ORAL | 1 refills | Status: DC

## 2024-02-24 NOTE — Telephone Encounter (Signed)
 Patient calling to report blackout episodes. Happened last Tuesday, she felt dizzy, eyes got blurry and went out then came back. She reports pressure in her head when these episodes happen.  She reported the pain her arm again ( left). No recent illness and infections. She went back to PCP and cardiologist and is now wearing heart monitor. Was advised to follow back up with neurology. She denies any convulsions. She also denies any shortness of breath and any recent increased anxiety. Advised I would send to Dr. Samara Crest to review

## 2024-02-24 NOTE — Progress Notes (Signed)
 Cardiology Office Note:  .   Date:  02/24/2024  ID:  Natalie Colon, DOB 1981-04-29, MRN [id number] PCP: Kline, Chianne, PA-C   [facility]Care Providers Cardiologist:  Natalie Batman, MD    History of Present Illness: Natalie Colon is a 43 y.o. female with a history of tobacco Colon, Natalie Colon, Natalie Colon, Natalie Colon, palpitations.  Establish in 2013 for chest pain EKG 04/23/2012 and 40 T waves in inferior leads, poor R wave progression, NSR.  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  Exercise stress test with no ischemia.  Cardiac monitor 06/2023 due to syncope with sinus rhythm several short run of SVT (longest 6 beats) and narrow complex atrial tachycardia (longest 8 beats).  Echo 07/2023 normal LVEF 60 to 55%, mild to moderate MR.  Stress echo 11/624 no ischemia.  Last seen by Dr. Abel Colon 10/02/2023.  Reassurance provided regarding recent cardiac testing which was reassuring.  She was recommended to continue metoprolol  25 mg daily, repeat echo 07/2024, and discussed persistent cough with her primary care provider.  History of Present Illness Presents today after contacting the office noting elevated heart rates 100s-130s with as little activity as walking from room to room. Her Apple watch was alerting her to "overload" or too much stress.   For the past week, she experiences daily episodes of increased heart rate, reaching up to 140 bpm, triggered by minimal activity such as moving around her home. During these episodes, she feels hot, sweaty, occasionally lightheaded, and experiences vision flashing. She often requires assistance due to having a house full of children.  She takes metoprolol  25 mg in the morning but continues to experience increased heart rate. Seated exercises like using a recumbent bike still cause her heart rate to rise. Her heart rate stabilizes when resting  in bed.  She limits alcohol intake to one drink on weekends and does not consume caffeine regularly, primarily drinking ginger ale and cranberry juice. Her daily routine involves minimal physical activity due to symptoms, often resting in bed to manage heart rate and fatigue.  Neurology previously prescribed Celexa  for anxiety. She self discontinued 2 weeks ago as did not feel it was effective. She follows with counseling and is not interested in psychiatry at this time.   ROS: Please see the history of present illness.    All other systems reviewed and are negative.   Studies Reviewed: .        Cardiac Studies & Procedures   ______________________________________________________________________________________________   STRESS TESTS  ECHOCARDIOGRAM STRESS TEST 09/01/2023  Narrative EXERCISE STRESS ECHO REPORT   --------------------------------------------------------------------------------  Patient Name:   Natalie Colon Date of Exam: 09/01/2023 Medical Rec #:  [id number]        Height:       67.0 in Accession #:    [id number]       Weight:       195.0 lb Date of Birth:  [birth year]        BSA:          2.001 m Patient Age:    42 years         BP:           134/88 mmHg Patient Gender: F                HR:           62 bpm. Exam Location:  [address]  Procedure: Stress Echo, Limited Color Doppler and Cardiac Doppler  Indications:    I05.1 MR  History:        Patient has prior history of Echocardiogram examinations, most recent 08/19/2023.  Sonographer:    Natalie Colon Referring Phys: Natalie Colon  IMPRESSIONS   1. Baseline MR very eccentric, probable mild to moderate At peak exercise there was extensive motion making imaging difficult. Overall MR does not appear to become severe. 2. Clinically negative, electrically negative for ischemia. Echo with normal hyperdynamic response. 3. This is a negative stress echocardiogram for ischemia. 4. This is a  low risk study.  FINDINGS  Exam Protocol: The patient exercised on a treadmill according to a Bruce protocol.   Patient Performance: The patient exercised for 4 minutes and 45 seconds, achieving 6.7 METS. The maximum stage achieved was II of the Bruce protocol. The baseline heart rate was 77 bpm. The heart rate at peak stress was 164 bpm. The target heart rate was calculated to be 152 bpm. The percentage of maximum predicted heart rate achieved was 91.7 %. The baseline blood pressure was 134/88 mmHg. The blood pressure at peak stress was 149/82 mmHg. The patient developed fatigue and shortness of breath during the stress exam. The symptoms resolved with rest.  EKG: Resting EKG showed normal sinus rhythm. The patient developed no abnormal EKG findings and motion artifact during exercise.   2D Echo Findings: The baseline ejection fraction was 60%. The peak ejection fraction at stress was 80%. Baseline regional wall motion abnormalities were not present. There were no stress-induced wall motion abnormalities. This is a negative stress echocardiogram for ischemia.   Natalie Berger MD Electronically signed on 09/03/2023 at 8:46:44 AM     Final   ECHOCARDIOGRAM  ECHOCARDIOGRAM COMPLETE 08/19/2023  Narrative ECHOCARDIOGRAM REPORT    Patient Name:   Natalie Colon Date of Exam: 08/19/2023 Medical Rec #:  [id number]        Height:       67.0 in Accession #:    [id number]       Weight:       199.0 lb Date of Birth:  [birth year]        BSA:          2.018 m Patient Age:    42 years         BP:           116/74 mmHg Patient Gender: F                HR:           62 bpm. Exam Location:  [place]  Procedure: 2D Echo, Cardiac Doppler and Color Doppler  Indications:    R01.1 Murmur  History:        Patient has prior history of Echocardiogram examinations, most recent 01/20/2018. Natalie Valve Disease and Natalie Valve Prolapse, Arrythmias:Palpitations, Signs/Symptoms:Murmur and Chest  Pain; Risk Factors:Former Smoker.  Sonographer:    Lyndal Sandy RDMS, RVT, Colon Referring Phys: [address] RAJAN R [facility]  IMPRESSIONS   1. Left ventricular ejection fraction, by estimation, is 60 to 65%. The left ventricle has normal function. The left ventricle has no regional wall motion abnormalities. Left ventricular diastolic parameters were normal. 2. Right ventricular systolic function is normal. The right ventricular size is normal. 3. Left atrial size was mildly dilated. 4. The Natalie valve is normal in structure. Mild to moderate Natalie valve Colon. No evidence of Natalie stenosis.  5. The aortic valve is normal in structure. Aortic valve Colon is not visualized. No aortic stenosis is present. 6. The inferior vena cava is normal in size with greater than 50% respiratory variability, suggesting right atrial pressure of 3 mmHg.  Comparison(s): Echocardiogram done 01/20/18 showed an EF of 55-60%.  FINDINGS Left Ventricle: Left ventricular ejection fraction, by estimation, is 60 to 65%. The left ventricle has normal function. The left ventricle has no regional wall motion abnormalities. The left ventricular internal cavity size was normal in size. There is no left ventricular hypertrophy. Left ventricular diastolic parameters were normal.  Right Ventricle: The right ventricular size is normal. No increase in right ventricular wall thickness. Right ventricular systolic function is normal.  Left Atrium: Left atrial size was mildly dilated.  Right Atrium: Right atrial size was normal in size.  Pericardium: There is no evidence of pericardial effusion.  Natalie Valve: The Natalie valve is normal in structure. Mild to moderate Natalie valve Colon. No evidence of Natalie valve stenosis.  Tricuspid Valve: The tricuspid valve is normal in structure. Tricuspid valve Colon is not demonstrated. No evidence of tricuspid stenosis.  Aortic Valve: The aortic valve is  normal in structure. Aortic valve Colon is not visualized. No aortic stenosis is present. Aortic valve mean gradient measures 3.0 mmHg. Aortic valve peak gradient measures 5.0 mmHg. Aortic valve area, by VTI measures 2.07 cm.  Pulmonic Valve: The pulmonic valve was normal in structure. Pulmonic valve Colon is not visualized. No evidence of pulmonic stenosis.  Aorta: The aortic root is normal in size and structure.  Venous: The inferior vena cava is normal in size with greater than 50% respiratory variability, suggesting right atrial pressure of 3 mmHg.  IAS/Shunts: No atrial level shunt detected by color flow Doppler.   LEFT VENTRICLE PLAX 2D LVIDd:         4.60 cm     Diastology LVIDs:         3.10 cm     LV e' medial:    9.57 cm/s LV PW:         1.00 cm     LV E/e' medial:  6.6 LV IVS:        0.80 cm     LV e' lateral:   12.00 cm/s LVOT diam:     1.80 cm     LV E/e' lateral: 5.3 LV SV:         49 LV SV Index:   24 LVOT Area:     2.54 cm  LV Volumes (MOD) LV vol d, MOD A2C: 63.7 ml LV vol d, MOD A4C: 60.8 ml LV vol s, MOD A2C: 27.2 ml LV vol s, MOD A4C: 24.0 ml LV SV MOD A2C:     36.5 ml LV SV MOD A4C:     60.8 ml LV SV MOD BP:      39.0 ml  RIGHT VENTRICLE RV S prime:     12.00 cm/s TAPSE (M-mode): 2.2 cm  LEFT ATRIUM           Index        RIGHT ATRIUM           Index LA diam:      3.10 cm 1.54 cm/m   RA Area:     10.40 cm LA Vol (A4C): 38.7 ml 19.18 ml/m  RA Volume:   18.70 ml  9.27 ml/m AORTIC VALVE AV Area (Vmax):    2.23 cm AV Area (Vmean):  2.23 cm AV Area (VTI):     2.07 cm AV Vmax:           112.00 cm/s AV Vmean:          77.600 cm/s AV VTI:            0.236 m AV Peak Grad:      5.0 mmHg AV Mean Grad:      3.0 mmHg LVOT Vmax:         98.20 cm/s LVOT Vmean:        68.000 cm/s LVOT VTI:          0.192 m LVOT/AV VTI ratio: 0.81  AORTA Ao Root diam: 3.10 cm  Natalie VALVE               TRICUSPID VALVE MV Area (PHT): 2.48 cm     TR Peak grad:   27.5 mmHg MV Decel Time: 306 msec    TR Vmax:        262.00 cm/s MR Peak grad: 78.3 mmHg MR Mean grad: 70.0 mmHg    SHUNTS MR Vmax:      442.50 cm/s  Systemic VTI:  0.19 m MR Vmean:     397.0 cm/s   Systemic Diam: 1.80 cm MV E velocity: 63.30 cm/s MV A velocity: 52.20 cm/s MV E/A ratio:  1.21  Hillis Lu MD Electronically signed by Hillis Lu MD Signature Date/Time: 08/20/2023/10:37:11 AM    Final    MONITORS  LONG TERM MONITOR (3-14 DAYS) 08/13/2023  Narrative Patch Wear Time:  13 days and 1 hours (2024-09-25T09:31:21-0400 to 2024-10-08T10:55:12-398)  Patient had a min HR of 48 bpm, max HR of 235 bpm, and avg HR of 88 bpm.  Predominant underlying rhythm was Sinus Rhythm.  2 Supraventricular Tachycardia runs occurred, the run with the fastest interval lasting 7 beats with a max rate of 138 bpm, the longest lasting 6 beats with an avg rate of 93 bpm. True duration of Supraventricular Tachycardia difficult to ascertain due to artifact. Isolated SVEs were rare (<1.0%), SVE Couplets were rare (<1.0%), and SVE Triplets were rare (<1.0%).  Isolated VEs were rare (<1.0%), VE Couplets were rare (<1.0%), and no VE Triplets.were present. Ventricular Bigeminy and Trigeminy were present.4 Ventricular Tachycardia runs occurred, the run with the fastest interval lasting 5 beats with a max rate of 235 bpm, the longest lasting 8 beats with an avg rate of 168 bpm.  Impression: Abnormal event monitor.  Brief atrial runs noted.  Nonsustained ventricular tachycardia as detailed above noted.       ______________________________________________________________________________________________      Risk Assessment/Calculations:         STOP-Bang Score:  3      Physical Exam:   VS:  BP 112/66   Pulse 71   Ht 5\' 7"  (1.702 m)   Wt 218 lb (98.9 kg)   SpO2 97%   BMI 34.14 kg/m    Wt Readings from Last 3 Encounters:  02/24/24 218 lb (98.9 kg)  10/02/23 206 lb 3.2 oz  (93.5 kg)  09/22/23 190 lb (86.2 kg)    GEN: Well nourished, well developed in no acute distress NECK: No JVD; No carotid bruits CARDIAC: RRR, no murmurs, rubs, gallops RESPIRATORY:  Clear to auscultation without rales, wheezing or rhonchi  ABDOMEN: Soft, non-tender, non-distended EXTREMITIES:  No edema; No deformity   ASSESSMENT AND PLAN: .    Assessment & Plan Palpitations and Tachycardia Episodes of tachycardia with heart rates up to 140  bpm, triggered by minimal exertion. Symptoms include sweating, lightheadedness, vision disturbances, chest pressure, and fatigue. Atypical symptoms for angina. Current metoprolol  dose may be insufficient. Reassurance provided regarding prior workup. Discussed that a heart rate 100-140 bpm while moving is not unreasonable or dangerous though she notes feeling poorly.  - Increase metoprolol  to 37.5 mg in the morning. - Order blood work: BMET, CBC, magnesium, TSH to rule out abnormality contributory to palpitations - Recommend at least 64 oz water per day, continuing to avoid caffeine, managing stress well.  - Apply a heart monitor per her request to assess heart rate and rhythm during daily activities. -Encouraged to gradually increase activity and not avoid exercise. Consider seated exercise as often better tolerated.  MR Mild to moderate by echo 07/2023. Repeat echo 07/2024 already ordered  Anxiety  Continue to follow with psychology. Did not feel Celexa  was effective.           Dispo: follow up in 2-3 months  Signed, Clearnce Curia, NP

## 2024-02-24 NOTE — Patient Instructions (Addendum)
 Medication Instructions:  CHANGE your Metoprolol  to 1.5 tablets (37.5mg ) in the morning  *If you need a refill on your cardiac medications before your next appointment, please call your pharmacy*  Lab Work: Your physician recommends that you return for lab work today: CBC, BMP, TSH, magnesium  If you have labs (blood work) drawn today and your tests are completely normal, you will receive your results only by: MyChart Message (if you have MyChart) OR A paper copy in the mail If you have any lab test that is abnormal or we need to change your treatment, we will call you to review the results.  Testing/Procedures: Your physician has recommended that you wear a Zio monitor.   This monitor is a medical device that records the heart's electrical activity. Doctors most often use these monitors to diagnose arrhythmias. Arrhythmias are problems with the speed or rhythm of the heartbeat. The monitor is a small device applied to your chest. You can wear one while you do your normal daily activities. While wearing this monitor if you have any symptoms to push the button and record what you felt. Once you have worn this monitor for the period of time provider prescribed (Usually 14 days), you will return the monitor device in the postage paid box. Once it is returned they will download the data collected and provide us  with a report which the provider will then review and we will call you with those results. Important tips:  Avoid showering during the first 24 hours of wearing the monitor. Avoid excessive sweating to help maximize wear time. Do not submerge the device, no hot tubs, and no swimming pools. Keep any lotions or oils away from the patch. After 24 hours you may shower with the patch on. Take brief showers with your back facing the shower head.  Do not remove patch once it has been placed because that will interrupt data and decrease adhesive wear time. Push the button when you have any  symptoms and write down what you were feeling. Once you have completed wearing your monitor, remove and place into box which has postage paid and place in your outgoing mailbox.  If for some reason you have misplaced your box then call our office and we can provide another box and/or mail it off for you.     Follow-Up: At [facility], you and your health needs are our priority.  As part of our continuing mission to provide you with exceptional heart care, our providers are all part of one team.  This team includes your primary Cardiologist (physician) and Advanced Practice Providers or APPs (Physician Assistants and Nurse Practitioners) who all work together to provide you with the care you need, when you need it.  Your next appointment:   2-3 month(s)  Provider:   Antoinette Batman, MD or Advanced Practice Provider  We recommend signing up for the patient portal called "MyChart".  Sign up information is provided on this After Visit Summary.  MyChart is used to connect with patients for Virtual Visits (Telemedicine).  Patients are able to view lab/test results, encounter notes, upcoming appointments, etc.  Non-urgent messages can be sent to your provider as well.   To learn more about what you can do with MyChart, go to [email or web address].   Other Instructions      To prevent palpitations: Make sure you are adequately hydrated. Recommend aiming for 64-80 oz of water. Avoid and/or limit caffeine containing beverages like soda or tea. Exercise regularly.  Manage stress well. Some over the counter medications can cause palpitations such as Benadryl , AdvilPM, TylenolPM. Regular Advil  or Tylenol  do not cause palpitations.    Exercise recommendations: The American Heart Association recommends 150 minutes of moderate intensity exercise weekly. This could include walking, jogging, or swimming. Seated or recumbent exercise is generally better tolerated in individuals who feel  their heart race. Starting small with 5 minutes three times per week then gradually increasing.

## 2024-02-24 NOTE — Telephone Encounter (Signed)
 Pt left a vm at 10:30 stating her primary Dr told her to call to report her episodes of blackouts/her vision coming and going.  Pt also mentioned on vm being back on her heart monitor, please call pt to discuss.

## 2024-02-25 ENCOUNTER — Encounter (HOSPITAL_BASED_OUTPATIENT_CLINIC_OR_DEPARTMENT_OTHER): Payer: Self-pay

## 2024-02-25 LAB — BASIC METABOLIC PANEL WITH GFR
BUN/Creatinine Ratio: 14 (ref 9–23)
BUN: 11 mg/dL (ref 6–24)
CO2: 22 mmol/L (ref 20–29)
Calcium: 9.3 mg/dL (ref 8.7–10.2)
Chloride: 104 mmol/L (ref 96–106)
Creatinine, Ser: 0.77 mg/dL (ref 0.57–1.00)
Glucose: 96 mg/dL (ref 70–99)
Potassium: 4.4 mmol/L (ref 3.5–5.2)
Sodium: 140 mmol/L (ref 134–144)
eGFR: 99 mL/min/{1.73_m2} (ref >59)

## 2024-02-25 LAB — TSH: TSH: 2.19 u[IU]/mL (ref 0.450–4.500)

## 2024-02-25 LAB — CBC
Hematocrit: 39.1 % (ref 34.0–46.6)
Hemoglobin: 12.4 g/dL (ref 11.1–15.9)
MCH: 26.1 pg — ABNORMAL LOW (ref 26.6–33.0)
MCHC: 31.7 g/dL (ref 31.5–35.7)
MCV: 82 fL (ref 79–97)
Platelets: 381 10*3/uL (ref 150–450)
RBC: 4.76 x10E6/uL (ref 3.77–5.28)
RDW: 11.9 % (ref 11.7–15.4)
WBC: 8.3 10*3/uL (ref 3.4–10.8)

## 2024-02-25 LAB — MAGNESIUM: Magnesium: 2.1 mg/dL (ref 1.6–2.3)

## 2024-02-26 ENCOUNTER — Other Ambulatory Visit: Payer: Self-pay | Admitting: Neurology

## 2024-02-26 ENCOUNTER — Ambulatory Visit: Admitting: Neurology

## 2024-02-26 DIAGNOSIS — R55 Syncope and collapse: Secondary | ICD-10-CM | POA: Diagnosis not present

## 2024-02-26 DIAGNOSIS — H547 Unspecified visual loss: Secondary | ICD-10-CM

## 2024-02-26 NOTE — Telephone Encounter (Signed)
 Please call and inform patient that we will repeat the routine EEG. Please schedule her for an eeg. Thanks

## 2024-02-26 NOTE — Telephone Encounter (Signed)
 Called and scheduled patient for EEG for 02/26/24 at 12:45pm. Patient stated that she was concerned that her heart monitor may affect the EEG. Spoke with Loetta Ringer and she said there should be no issue

## 2024-02-26 NOTE — Procedures (Signed)
   History:  43 year old woman with events concerning for seizures   EEG classification:  Awake and asleep  Duration: 26 minutes   Technical aspects: This EEG study was done with scalp electrodes positioned according to the 10-20 International system of electrode placement. Electrical activity was reviewed with band pass filter of 1-70Hz , sensitivity of 7 uV/mm, display speed of 47mm/sec with a 60Hz  notched filter applied as appropriate. EEG data were recorded continuously and digitally stored.   Description of the recording: The background rhythms of this recording consists of a fairly well modulated medium amplitude background activity of 10 Hz. As the record progresses, the patient initially is in the waking state, but appears to enter the early stage II sleep during the recording, with rudimentary sleep spindles and vertex sharp wave activity seen. During the wakeful state, photic stimulation was performed, and no abnormal responses were seen. Hyperventilation was also performed, no abnormal response seen. No epileptiform discharges seen during this recording. There was no focal slowing.   Abnormality: None   Impression: This is a normal awake and sleep EEG. No evidence of interictal epileptiform discharges. Normal EEGs, however, do not rule out epilepsy.    Dustie Brittle, MD [facility]

## 2024-02-26 NOTE — Telephone Encounter (Signed)
 Call to patient, advised that Dr. Samara Crest wants to repeat EEG. She is in agreement.

## 2024-03-01 ENCOUNTER — Encounter: Payer: Self-pay | Admitting: Neurology

## 2024-03-15 DIAGNOSIS — I471 Supraventricular tachycardia, unspecified: Secondary | ICD-10-CM | POA: Diagnosis not present

## 2024-03-15 DIAGNOSIS — R002 Palpitations: Secondary | ICD-10-CM

## 2024-03-16 ENCOUNTER — Ambulatory Visit (HOSPITAL_BASED_OUTPATIENT_CLINIC_OR_DEPARTMENT_OTHER): Payer: Self-pay | Admitting: Family

## 2024-03-16 NOTE — Telephone Encounter (Signed)
Follow up questions?  

## 2024-05-20 ENCOUNTER — Encounter (HOSPITAL_BASED_OUTPATIENT_CLINIC_OR_DEPARTMENT_OTHER): Payer: Self-pay

## 2024-06-01 ENCOUNTER — Ambulatory Visit (HOSPITAL_BASED_OUTPATIENT_CLINIC_OR_DEPARTMENT_OTHER): Admitting: Family

## 2024-06-01 ENCOUNTER — Telehealth (HOSPITAL_BASED_OUTPATIENT_CLINIC_OR_DEPARTMENT_OTHER): Payer: Self-pay | Admitting: *Deleted

## 2024-06-01 NOTE — Telephone Encounter (Signed)
 Visit rescheduled.   Natalie Colon S Pasquale Matters, NP

## 2024-06-01 NOTE — Telephone Encounter (Signed)
 S/w pt did not want to keep todays appointment. R/S with Dr. Verlin in November per [place].

## 2024-07-14 ENCOUNTER — Other Ambulatory Visit: Payer: Self-pay | Admitting: Physician Assistant

## 2024-07-14 DIAGNOSIS — Z1231 Encounter for screening mammogram for malignant neoplasm of breast: Secondary | ICD-10-CM

## 2024-08-27 ENCOUNTER — Ambulatory Visit

## 2024-09-16 ENCOUNTER — Ambulatory Visit: Attending: Cardiovascular Disease | Admitting: Cardiovascular Disease

## 2024-09-16 ENCOUNTER — Encounter: Payer: Self-pay | Admitting: Cardiovascular Disease

## 2024-09-16 VITALS — BP 98/66 | HR 74 | Ht 67.0 in | Wt 213.0 lb

## 2024-09-16 DIAGNOSIS — I471 Supraventricular tachycardia, unspecified: Secondary | ICD-10-CM | POA: Insufficient documentation

## 2024-09-16 DIAGNOSIS — I340 Nonrheumatic mitral (valve) insufficiency: Secondary | ICD-10-CM | POA: Diagnosis present

## 2024-09-16 MED ORDER — METOPROLOL SUCCINATE ER 25 MG PO TB24: 25.0000 mg | ORAL_TABLET | Freq: Every day | ORAL | 3 refills | Status: AC

## 2024-09-16 NOTE — Patient Instructions (Signed)
 Medication Instructions:  Start Metoprolol  Succinate 25 mg by mouth daily  *If you need a refill on your cardiac medications before your next appointment, please call your pharmacy*  Lab Work: none If you have labs (blood work) drawn today and your tests are completely normal, you will receive your results only by: MyChart Message (if you have MyChart) OR A paper copy in the mail If you have any lab test that is abnormal or we need to change your treatment, we will call you to review the results.  Testing/Procedures: Your physician has requested that you have an echocardiogram. Echocardiography is a painless test that uses sound waves to create images of your heart. It provides your doctor with information about the size and shape of your heart and how well your heart's chambers and valves are working. This procedure takes approximately one hour. There are no restrictions for this procedure. Please do NOT wear cologne, perfume, aftershave, or lotions (deodorant is allowed). Please arrive 15 minutes prior to your appointment time.  Please note: We ask at that you not bring children with you during ultrasound (echo/ vascular) testing. Due to room size and safety concerns, children are not allowed in the ultrasound rooms during exams. Our front office staff cannot provide observation of children in our lobby area while testing is being conducted. An adult accompanying a patient to their appointment will only be allowed in the ultrasound room at the discretion of the ultrasound technician under special circumstances. We apologize for any inconvenience.   Follow-Up: At [facility], you and your health needs are our priority.  As part of our continuing mission to provide you with exceptional heart care, our providers are all part of one team.  This team includes your primary Cardiologist (physician) and Advanced Practice Providers or APPs (Physician Assistants and Nurse Practitioners) who all  work together to provide you with the care you need, when you need it.  Your next appointment:   12 month(s)  Provider:   Lonni Cash, MD    We recommend signing up for the patient portal called MyChart.  Sign up information is provided on this After Visit Summary.  MyChart is used to connect with patients for Virtual Visits (Telemedicine).  Patients are able to view lab/test results, encounter notes, upcoming appointments, etc.  Non-urgent messages can be sent to your provider as well.   To learn more about what you can do with MyChart, go to [email or web address].   Other Instructions

## 2024-09-16 NOTE — Progress Notes (Signed)
 Chief Complaint  Patient presents with   Follow-up    SVT   History of Present Illness: 43 yo female with history of SVT, tobacco abuse, mitral valve regurgitation, ADHD and depression who is here today for cardiac follow up. She was seen in our office in 2013 for chest pain. She had been seen in the ED at [facility] on 04/23/12 with c/o chest pain and her EKG showed inverted T waves inferior leads, poor R wave progression through the precordial leads. 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 She had no EKG changes c/w ischemia during exercise stress test in 2013. She was seen in 2019 with c/o chest pain but cancelled a coronary CTA that was ordered. She was seen in September 2024 by Dr. Edwyna and c/o palpitations, black out spells and chest pain which was felt to be atypical for a cardiac cause. Cardiac monitor September 2024 with sinus with several short runs of SVT (longest 6 beats) and narrow complex atrial tachycardia (longest lasting 8 beats). Rare PACs. Echo October 2024 with LVEF=60-65%. Mild to moderate MR. Stress echo November 2024 with no ischemia. She was seen in our office in April 2025 and reported tachycardia at home. Cardiac monitor May 2025 with sinus, one 6 beat run of VT, 2 runs of SVT, longest 10 beats. Rare PVCs. Toprol  was increased to 37.5 mg daily. She did not tolerate the Toprol  secondary to fatigue.   She is here today for follow up. The patient denies any chest pain, dyspnea, palpitations, lower extremity edema, orthopnea, PND, dizziness, near syncope or syncope. She did not tolerate her beta blocker   Primary Care Physician: Kline, Chianne, PA-C  Past Medical History:  Diagnosis Date   ADD (attention deficit disorder) 07/22/2011   ADHD (attention deficit hyperactivity disorder)    Allergic rhinitis 01/23/2011   Formatting of this note might be different from the original.  Allergic Rhinitis 10/1 IMO update     Anxiety 10/03/2015   Last Assessment & Plan: Formatting of this note might be different from the original. Increased/worsening anxiety and depression.  Will restart patient on Trintellix as she reports this was a good regimen for her.  Started 10 mg.  Patient was psychiatry follow-up in 4 weeks.  We will follow-up pending evaluation.  Patient about to lose her Medicaid for her reports.  We will follow-up with family s   Asthma    Candidiasis of vagina 01/20/2023   Chest pain 05/20/2012   Depression    Fatigue 10/03/2015   Last Assessment & Plan: Formatting of this note might be different from the original. Patient with increased fatigue.  Chronic problem.  Reports history of iron deficiency anemia.  Concurrent depression.  Will treat patient for depression as well as draw basic labs for further evaluation.  Follow-up pending labs.     Genital herpes 10/16/2009   Formatting of this note might be different from the original. Herpes Simplex Type II 10/1 IMO update Last Assessment & Plan: Formatting of this note might be different from the original. Patient with past positivity for HSV 1 and 2 serotype. Notes some genital lesions in the past as well. Patient denies any recent symptoms or need for valtrex. Patient is concerned as she is in a new relationship a   Herpes simplex 01/20/2023   Herpesvirus infection 01/20/2023   History of trichomonal vaginitis 12/24/2016   Last Assessment &  Plan: Formatting of this note might be different from the original. History of trichomonas- dx in clinic on 12/17/16 by Dr. Rena. Completed treatment with Flagyl  12/20/16. Pt requesting repeat testing today. Per patient, partner was treated for trichomonas after testing positive. Recheck today to assure no reinfection. Pt verbalizes understanding for current treatment plan.     Mitral valve prolapse 10/28/2008   Echo and workup in [place]    Moderate episode of recurrent major  depressive disorder (HCC) 02/27/2011   Formatting of this note might be different from the original. Depression     Postcoital bleeding 01/20/2023   Sexually transmitted disease 01/20/2023   Vaginal discharge 01/20/2023   Vitamin D deficiency 10/15/2010   Formatting of this note might be different from the original. Vitamin D Deficiency 10/1 IMO update      Past Surgical History:  Procedure Laterality Date   CYSTECTOMY     Neck   Pilonidal cyst removal     Tubal implant procedure      Current Outpatient Medications  Medication Sig Dispense Refill   cetirizine  (ZYRTEC  ALLERGY) 10 MG tablet Take 1 tablet (10 mg total) by mouth daily. (Patient taking differently: Take by mouth as needed for allergies.) 30 tablet 0   metoprolol  succinate (TOPROL  XL) 25 MG 24 hr tablet Take 1 tablet (25 mg total) by mouth daily. 90 tablet 3   fluticasone  (FLONASE ) 50 MCG/ACT nasal spray Place 1 spray into both nostrils daily. (Patient not taking: Reported on 09/16/2024)     fluticasone  (FLOVENT  HFA) 220 MCG/ACT inhaler Inhale 2 puffs into the lungs every 12 (twelve) hours. (Patient not taking: Reported on 09/16/2024) 1 each 5   VENTOLIN  HFA 108 (90 Base) MCG/ACT inhaler Inhale 1-2 puffs into the lungs every 4 (four) hours as needed. (Patient not taking: Reported on 09/16/2024) 18 g 2   No current facility-administered medications for this visit.    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  Tobacco Use   Smoking status: Former     Current packs/day: 0.00    Average packs/day: 0.2 packs/day for 10.0 years (2.0 ttl pk-yrs)    Types: Cigarettes    Start date: 2013    Quit date: 2023    Years since quitting: 2.8   Smokeless tobacco: Never  Vaping Use   Vaping status: Never Used  Substance and Sexual Activity   Alcohol use: Not Currently   Drug use: No   Sexual activity: Not on file  Other Topics Concern   Not on file  Social History Narrative   Caffeine: none   Social Drivers of Health   Financial Resource Strain: Low Risk  (08/24/2024)   Received from [facility]   Overall Financial Resource Strain (CARDIA)    How hard is it for you to pay for the very basics like food, housing, medical care, and heating?: Not very hard  Food Insecurity: No Food Insecurity (08/24/2024)   Received [facility]   Hunger Vital Sign  Within the past 12 months, you worried that your food would run out before you got the money to buy more.: Never true    Within the past 12 months, the food you bought just didn't last and you didn't have money to get more.: Never true  Transportation Needs: Unmet Transportation Needs (08/24/2024)   Received from [facility] - Transportation    In the past 12 months, has lack of transportation kept you from medical appointments or from getting medications?: Yes    In the past 12 months, has lack of transportation kept you from meetings, work, or from getting things needed for daily living?: Yes  Physical Activity: Insufficiently Active (08/24/2024)   Received from [facility]   Exercise Vital Sign    On average, how many days per week do you engage in moderate to strenuous exercise (like a brisk walk)?: 1 day    On average, how many minutes do you engage in exercise at this level?: 10 min  Stress: No Stress Concern Present (08/24/2024)   Received from [facility] - Occupational Stress Questionnaire    Do you feel stress -  tense, restless, nervous, or anxious, or unable to sleep at night because your mind is troubled all the time - these days?: Only a little  Social Connections: Moderately Integrated (08/24/2024)   Received from [facility]   Social Network    How would you rate your social network (family, work, friends)?: Adequate participation with social networks  Intimate Partner Violence: Not At Risk (08/24/2024)   Received from [facility]   HITS    Over the last 12 months how often did your partner physically hurt you?: Never    Over the last 12 months how often did your partner insult you or talk down to you?: Rarely    Over the last 12 months how often did your partner threaten you with physical harm?: Never    Over the last 12 months how often did your partner scream or curse at you?: Never    Family History  Problem Relation Age of Onset   COPD Mother    Hypertension Mother    Glaucoma Father    High Cholesterol Father    Breast cancer Maternal Grandmother    Heart attack Maternal Grandmother    COPD Maternal Grandmother    Heart disease Maternal Grandfather    COPD Maternal Grandfather     Review of Systems:  As stated in the HPI and otherwise negative.   BP 98/66   Pulse 74   Ht 5' 7 (1.702 m)   Wt 213 lb (96.6 kg)   LMP 08/30/2024   SpO2 99%   BMI 33.36 kg/m   Physical Examination: General: Well developed, well nourished, NAD  HEENT: OP clear, mucus membranes moist  SKIN: warm, dry. No rashes. Neuro: No focal deficits  Musculoskeletal: Muscle strength 5/5 all ext  Psychiatric: Mood and affect normal  Neck: No JVD, no carotid bruits, no thyromegaly, no lymphadenopathy.  Lungs:Clear bilaterally, no wheezes, rhonci, crackles Cardiovascular: Regular rate and rhythm. No murmurs, gallops or rubs. Abdomen:Soft. Bowel sounds present. Non-tender.  Extremities: No lower extremity edema. Pulses are 2 + in the bilateral DP/PT.  EKG:  EKG is ordered today. The EKG is  personally reviewed:  EKG Interpretation Date/Time:  Thursday September 16 2024 16:13:50 EST Ventricular Rate:  74 PR Interval:  140 QRS Duration:  70 QT Interval:  420  QTC Calculation: 466 R Axis:   5  Text Interpretation: Normal sinus rhythm Confirmed by Verlin Bruckner [phone]) on 09/16/2024 4:17:37 PM    Recent Labs: 02/24/2024: BUN 11; Creatinine, Ser 0.77; Hemoglobin 12.4; Magnesium 2.1; Platelets 381; Potassium 4.4; Sodium 140; TSH 2.190   Lipid Panel    Component Value Date/Time   CHOL 158 07/24/2023 1321   TRIG 89 07/24/2023 1321   HDL 55 07/24/2023 1321   CHOLHDL 2.9 07/24/2023 1321   LDLCALC 86 07/24/2023 1321     Wt Readings from Last 3 Encounters:  09/16/24 213 lb (96.6 kg)  02/24/24 218 lb (98.9 kg)  10/02/23 206 lb 3.2 oz (93.5 kg)    Assessment and Plan:   1. Mitral valve regurgitation: Mild to moderate by echo October 2024. Will repeat echo now.   2. SVT/Atrial tachycardia/PACs, PVCs: She continues to feel her heart race at times. This happens at rest and with exertion. She is currently doing PT. Normal LV function by echo in October 2024 with normal stress echo in November 2024. Cardiac monitor in May 2025 with sinus, PACs, PVCs, 2 short runs of SVT and one 6 beat run of VT. She stopped the Toprol  secondary to fatigue. She is willing to restart the Toprol  25 mg once daily.  If she does not tolerate this, can consider Cardizem CD 120 mg daily.   Labs/ tests ordered today include:   Orders Placed This Encounter  Procedures   EKG 12-Lead   ECHOCARDIOGRAM COMPLETE   Disposition:   F/U with me  in 12 months   Signed, Bruckner Verlin, MD 09/16/2024 4:50 PM    [facility] [address] [place], [place], [place]  [address] Phone: [phone]; Fax: [phone]

## 2024-09-22 ENCOUNTER — Encounter: Payer: Self-pay | Admitting: Neurology

## 2024-09-22 ENCOUNTER — Telehealth: Payer: Self-pay | Admitting: Neurology

## 2024-09-22 ENCOUNTER — Encounter: Payer: Self-pay | Admitting: Cardiovascular Disease

## 2024-09-22 NOTE — Telephone Encounter (Signed)
 Please remind next week to add her to the schedule for a visit

## 2024-09-22 NOTE — Telephone Encounter (Signed)
 I would recommend patient to go to Urgent care of follow up with PCP regarding arm pain.

## 2024-09-22 NOTE — Telephone Encounter (Signed)
 Last saw Dr. Gregg for OV 09/08/23. Per last OV note: The patient reported episodes of complete vision loss, described as blackouts, which were accompanied by severe pain radiating down the arm. 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 The vision loss lasted approximately two minutes, during which the patient remained conscious and could hear their surroundings. She could also speak and follow commands.   EEG results from 02/26/2024:  Impression: This is a normal awake and sleep EEG. No evidence of interictal epileptiform discharges. Normal EEGs, however, do not rule out epilepsy.  Plan per last OV note:     I called pt at [phone].  Reports she had severe pain in whole right arm that occurred yesterday and last 4 minutes. She was on the phone prior to event, looking up directions. Was unable to talk during. Had to hold arm/not move d/t pain. Did not black out.  No dizziness, confusion, vision changes during episode. Had a similar episode at [facility] about 2 months. Episode lasted for awhile. Resolved on its own.  Never started Celexa  20mg  daily. Never saw psychiatrist. Daughter's psychiatrist thought if she could help her daughter's situation, it could in return help reduce her stress levels. Daughter has improved and under less stress. She now appt upcoming with psychiatrist 09/2024. Still going to regular therapist once every 2 wk.   Asked about Cardiology. She saw them last week. Placed back on metoprolol  25mg  daily for SVT.   Pt wanting to know what she can take for pain during episodes?  Saw ophthalmology/[facility] 06/09/24.in [facility] and told exam normal.  Aware I will send to Dr. Gregg to review and we will call back with recommendation.

## 2024-09-22 NOTE — Telephone Encounter (Signed)
 Pt called stating that the pain in her right hand has  came back  . She said it has been hurting for a while and not sure why . Pt stated that MD informed to stop using  Keppra  . However Pt stated the last time her hand was hurting like this it triggered a seizure. Pt is not sure what's causing this Pain nor what to do and wanted to  speak to MD about what she should do ?

## 2024-09-22 NOTE — Telephone Encounter (Addendum)
 Pt is calling asking to speak with CMA, please return call to pt

## 2024-09-22 NOTE — Telephone Encounter (Signed)
 Called and relayed information of going to pcp and urgent care. Pt seemed upset with this answer and stated that they won't help her but that she will reach back out to them again

## 2024-09-27 NOTE — Telephone Encounter (Signed)
 Can she come in today at 37 (I have a cancellation) or you can schedule her for a video visit. Thanks

## 2024-09-28 ENCOUNTER — Ambulatory Visit: Admitting: Neurology

## 2024-09-28 ENCOUNTER — Encounter: Payer: Self-pay | Admitting: Neurology

## 2024-09-28 VITALS — BP 131/84 | HR 72 | Resp 15 | Ht 67.0 in | Wt 214.5 lb

## 2024-09-28 DIAGNOSIS — M79601 Pain in right arm: Secondary | ICD-10-CM | POA: Diagnosis not present

## 2024-09-28 DIAGNOSIS — R9401 Abnormal electroencephalogram [EEG]: Secondary | ICD-10-CM | POA: Diagnosis not present

## 2024-09-28 MED ORDER — LAMOTRIGINE 25 MG PO TABS: ORAL_TABLET | ORAL | 0 refills | Status: DC

## 2024-09-28 MED ORDER — LAMOTRIGINE 100 MG PO TABS: 100.0000 mg | ORAL_TABLET | Freq: Every day | ORAL | 6 refills | Status: AC

## 2024-09-28 NOTE — Patient Instructions (Signed)
-   Will start lamotrigine 25 mg with goal of 100 mg daily - Please all if you do have another events - Instructed to record episodes on video if possible for further evaluation. - Return in 6 months or sooner if worse

## 2024-09-28 NOTE — Progress Notes (Signed)
 [facility]  PATIENT: Natalie Colon DOB: April 06, 1981  REQUESTING CLINICIAN: Kline, Chianne, PA-C HISTORY FROM: Patient  REASON FOR VISIT: Right arm pain    HISTORICAL  CHIEF COMPLAINT:  Chief Complaint  Patient presents with   Follow-up    RM12, HUSBAND PRESENT, P IS HERE FOR worsening Sympthoms    HISTORY OF PRESENT ILLNESS:  Discussed the use of AI scribe software for clinical note transcription with the patient, who gave verbal consent to proceed.  History of Present Illness   Natalie Colon is a 43 year old female who presents with complaints of intermittent sharp pain in the arms.  She experiences intermittent sharp pain in her arms, primarily affecting the right arm, though occasionally the left arm is involved. The pain is described as a sharp, squeezing sensation that travels down the arm, lasting approximately four minutes. During these episodes, she is unable to move her arm due to the intensity of the pain, which sometimes brings her to tears. She has experienced this pain three times since her last visit, which was November last year.  A cervical spine MRI was conducted last year, did not show any acute abnormality. She tells me this is the same type of pain that she has been experiencing in the past 6 years, sometimes associated with episode of blacking out  No neck pain, consistent joint pain, or numbness associated with the arm pain. She can move her arm, though she prefers to keep it still during episodes to ease the pain. She notes feeling tired after these episodes.  She has a history of being evaluated for syncope and seizures, but she has never passed out during these episodes. She experiences dark vision sometimes when the pain occurs, which led to a consideration of vasovagal syncope, but she has not experienced loss of consciousness.    OTHER MEDICAL CONDITIONS: Asthma, dizziness, anxiety/depression   REVIEW OF SYSTEMS: Full 14  system review of systems performed and negative with exception of: As noted in the HPI  ALLERGIES: Allergies  Allergen Reactions   Bee Venom    Latex Itching   Penicillins Other (See Comments)    unknown   Vicodin [Hydrocodone-Acetaminophen ] Other (See Comments)    Heart racing   Hydrocodone-Acetaminophen  Palpitations    HOME MEDICATIONS: Outpatient Medications Prior to Visit  Medication Sig Dispense Refill   cetirizine  (ZYRTEC  ALLERGY) 10 MG tablet Take 1 tablet (10 mg total) by mouth daily. (Patient taking differently: Take by mouth as needed for allergies.) 30 tablet 0   fluticasone  (FLONASE ) 50 MCG/ACT nasal spray Place 1 spray into both nostrils daily.     fluticasone  (FLOVENT  HFA) 220 MCG/ACT inhaler Inhale 2 puffs into the lungs every 12 (twelve) hours. 1 each 5   metoprolol  succinate (TOPROL  XL) 25 MG 24 hr tablet Take 1 tablet (25 mg total) by mouth daily. 90 tablet 3   valACYclovir (VALTREX) 500 MG tablet Take 500 mg by mouth daily.     VENTOLIN  HFA 108 (90 Base) MCG/ACT inhaler Inhale 1-2 puffs into the lungs every 4 (four) hours as needed. 18 g 2   No facility-administered medications prior to visit.    PAST MEDICAL HISTORY: Past Medical History:  Diagnosis Date   ADD (attention deficit disorder) 07/22/2011   ADHD (attention deficit hyperactivity disorder)    Allergic rhinitis 01/23/2011   Formatting of this note might be different from the original. Allergic Rhinitis 10/1 IMO update     Anxiety 10/03/2015   Last  Assessment & Plan: Formatting of this note might be different from the original. Increased/worsening anxiety and depression.  Will restart patient on Trintellix as she reports this was a good regimen for her.  Started 10 mg.  Patient was psychiatry follow-up in 4 weeks.  We will follow-up pending evaluation.  Patient about to lose her Medicaid for her reports.  We will follow-up with family s   Asthma    Candidiasis of vagina 01/20/2023   Chest pain  05/20/2012   Depression    Fatigue 10/03/2015   Last Assessment & Plan: Formatting of this note might be different from the original. Patient with increased fatigue.  Chronic problem.  Reports history of iron deficiency anemia.  Concurrent depression.  Will treat patient for depression as well as draw basic labs for further evaluation.  Follow-up pending labs.     Genital herpes 10/16/2009   Formatting of this note might be different from the original. Herpes Simplex Type II 10/1 IMO update Last Assessment & Plan: Formatting of this note might be different from the original. Patient with past positivity for HSV 1 and 2 serotype. Notes some genital lesions in the past as well. Patient denies any recent symptoms or need for valtrex. Patient is concerned as she is in a new relationship a   Herpes simplex 01/20/2023   Herpesvirus infection 01/20/2023   History of trichomonal vaginitis 12/24/2016   Last Assessment & Plan: Formatting of this note might be different from the original. History of trichomonas- dx in clinic on 12/17/16 by Dr. Rena. Completed treatment with Flagyl  12/20/16. Pt requesting repeat testing today. Per patient, partner was treated for trichomonas after testing positive. Recheck today to assure no reinfection. Pt verbalizes understanding for current treatment plan.     Mitral valve prolapse 10/28/2008   Echo and workup in [place]    Moderate episode of recurrent major depressive disorder (HCC) 02/27/2011   Formatting of this note might be different from the original. Depression     Postcoital bleeding 01/20/2023   Sexually transmitted disease 01/20/2023   Vaginal discharge 01/20/2023   Vitamin D deficiency 10/15/2010   Formatting of this note might be different from the original. Vitamin D Deficiency 10/1 IMO update      PAST SURGICAL HISTORY: Past Surgical History:  Procedure Laterality Date   CYSTECTOMY     Neck   Pilonidal cyst removal     Tubal implant procedure       FAMILY HISTORY: Family History  Problem Relation Age of Onset   COPD Mother    Hypertension Mother    Glaucoma Father    High Cholesterol Father    Breast cancer Maternal Grandmother    Heart attack Maternal Grandmother    COPD Maternal Grandmother    Heart disease Maternal Grandfather    COPD Maternal Grandfather     SOCIAL HISTORY: Social History   Socioeconomic History   Marital status: Divorced    Spouse name: Not on file   Number of children: 3   Years of education: Not on file   Highest education level: Associate degree: academic program  Occupational History   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  Tobacco Use   Smoking status: Former    Current packs/day: 0.00    Average packs/day: 0.2 packs/day for 10.0 years (2.0 ttl pk-yrs)    Types: Cigarettes    Start date: 2013    Quit date: 2023    Years since quitting: 2.9   Smokeless tobacco: Never  Vaping Use   Vaping status: Never Used  Substance and Sexual Activity   Alcohol use: Not Currently   Drug use: No   Sexual activity: Not on file  Other Topics Concern   Not on file  Social History Narrative   Caffeine: none   Social Drivers of Health   Financial Resource Strain: Low Risk  (08/24/2024)   Received from [facility]   Overall Financial Resource Strain (CARDIA)    How hard is it for you to pay for the very basics like food, housing, medical care, and heating?: Not very hard  Food Insecurity: No Food Insecurity (08/24/2024)   Received from [facility]   Hunger Vital Sign    Within the past 12 months, you worried that your food would run out before you got the money to buy more.: Never true    Within the past 12 months, the food you bought just didn't last and you didn't have money to get more.: Never true  Transportation Needs: Unmet Transportation Needs (08/24/2024)   Received from [facility] - Transportation    In the past 12 months, has lack of transportation kept you from  medical appointments or from getting medications?: Yes    In the past 12 months, has lack of transportation kept you from meetings, work, or from getting things needed for daily living?: Yes  Physical Activity: Insufficiently Active (08/24/2024)   Received from [facility]   Exercise Vital Sign    On average, how many days per week do you engage in moderate to strenuous exercise (like a brisk walk)?: 1 day    On average, how many minutes do you engage in exercise at this level?: 10 min  Stress: No Stress Concern Present (08/24/2024)   Received from [facility] - Occupational Stress Questionnaire    Do you feel stress - tense, restless, nervous, or anxious, or unable to sleep at night because your mind is troubled all the time - these days?: Only a little  Social Connections: Moderately Integrated (08/24/2024)   Received from [facility]   Social Network    How would you rate your social network (family, work, friends)?: Adequate participation with social networks  Intimate Partner Violence: Not At Risk (08/24/2024)   Received from [facility]   HITS    Over the last 12 months how often did your partner physically hurt you?: Never    Over the last 12 months how often did your partner insult you or talk down to you?: Rarely    Over the last 12 months how often did your partner threaten you with physical harm?: Never    Over the last 12 months how often did your partner scream or curse at you?: Never     PHYSICAL EXAM   GENERAL EXAM/CONSTITUTIONAL: Vitals:  Vitals:   09/28/24 1147  BP: 131/84  Pulse: 72  Resp: 15  SpO2: 94%  Weight: 214 lb 8 oz (97.3 kg)  Height: 5' 7 (1.702 m)   Body mass index is 33.6 kg/m. Wt Readings from Last 3 Encounters:  09/28/24 214 lb 8 oz (97.3 kg)  09/16/24 213 lb (96.6 kg)  02/24/24 218 lb (98.9 kg)   Patient is in no distress; well developed, nourished and groomed; neck is  supple  MUSCULOSKELETAL: Gait, strength, tone, movements noted in Neurologic exam below  NEUROLOGIC: MENTAL STATUS:      No data to display  awake, alert, oriented to person, place and time recent and remote memory intact normal attention and concentration language fluent, comprehension intact, naming intact fund of knowledge appropriate  CRANIAL NERVE:  2nd, 3rd, 4th, 6th - Visual fields full to confrontation, extraocular muscles intact, no nystagmus 5th - facial sensation symmetric 7th - facial strength symmetric 8th - hearing intact 9th - palate elevates symmetrically, uvula midline 11th - shoulder shrug symmetric 12th - tongue protrusion midline  MOTOR:  normal bulk and tone, full strength in the BUE, BLE  SENSORY:  normal and symmetric to light touch  COORDINATION:  finger-nose-finger, fine finger movements normal  REFLEXES:  deep tendon reflexes present and symmetric  GAIT/STATION:  normal     DIAGNOSTIC DATA (LABS, IMAGING, TESTING) - I reviewed patient records, labs, notes, testing and imaging myself where available.  Lab Results  Component Value Date   WBC 8.3 02/24/2024   HGB 12.4 02/24/2024   HCT 39.1 02/24/2024   MCV 82 02/24/2024   PLT 381 02/24/2024      Component Value Date/Time   NA 140 02/24/2024 1006   K 4.4 02/24/2024 1006   CL 104 02/24/2024 1006   CO2 22 02/24/2024 1006   GLUCOSE 96 02/24/2024 1006   GLUCOSE 125 (H) 09/22/2023 1945   BUN 11 02/24/2024 1006   CREATININE 0.77 02/24/2024 1006   CALCIUM 9.3 02/24/2024 1006   PROT 6.5 07/24/2023 1321   ALBUMIN 3.9 07/24/2023 1321   AST 12 07/24/2023 1321   ALT 10 07/24/2023 1321   ALKPHOS 37 (L) 07/24/2023 1321   BILITOT 0.3 07/24/2023 1321   GFRNONAA >60 09/22/2023 1945   GFRAA >60 01/02/2018 2148   Lab Results  Component Value Date   CHOL 158 07/24/2023   HDL 55 07/24/2023   LDLCALC 86 07/24/2023   TRIG 89 07/24/2023   CHOLHDL 2.9 07/24/2023   No results  found for: HGBA1C No results found for: VITAMINB12 Lab Results  Component Value Date   TSH 2.190 02/24/2024    EEG 02/26/2024 Normal   MRI Cervical spine 08/08/2023 The spinal cord appears normal. Discs degenerative changes at C5-C6 and C4-C5 that do not lead to significant foraminal narrowing, spinal stenosis or nerve root compression.    ASSESSMENT AND PLAN  43 y.o. 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 Occurs approximately three times in the past year. Previous cervical spine MRI showed mild arthritis without nerve or spinal cord compression. Differential diagnosis includes pain from radiculopathy versus musculoskeletal pain, she did an abnormal EEG in the past, seizure in the differential but low. Cardiologist ruled out cardiac causes. No associated numbness, but intense pain present.  Stress level is manageable. - Will start lamotrigine 25 mg with goal of 100 mg daily - Please all if you do have another events - Instructed to record episodes on video if possible for further evaluation. - Return in 6 months or sooner if worse      1. Pain of right upper extremity   2. Abnormal EEG      Patient Instructions  - Will start lamotrigine 25 mg with goal of 100 mg daily - Please all if you do have another events - Instructed to record episodes on video if possible for further evaluation. - Return in 6 months or sooner if worse   Orders Placed This Encounter  Procedures   EEG adult    Meds ordered this encounter  Medications   lamoTRIgine (LAMICTAL) 25 MG tablet    Sig: Take 1 tablet (25 mg total) by mouth daily for 7 days, THEN 2 tablets (50 mg total) daily for 7 days, THEN 3 tablets (75 mg total) daily for 7 days, THEN 4 tablets (100 mg total) daily for 7 days.    Dispense:  70 tablet    Refill:   0   lamoTRIgine (LAMICTAL) 100 MG tablet    Sig: Take 1 tablet (100 mg total) by mouth daily.    Dispense:  30 tablet    Refill:  6    Please dispense on or after December 29    Return if symptoms worsen or fail to improve.    Pastor Falling, MD 09/28/2024, 1:08 PM  [facility] [address], [address] [place], [place] [address] [phone]

## 2024-10-04 ENCOUNTER — Ambulatory Visit: Admitting: *Deleted

## 2024-10-04 DIAGNOSIS — R569 Unspecified convulsions: Secondary | ICD-10-CM

## 2024-10-04 DIAGNOSIS — R9401 Abnormal electroencephalogram [EEG]: Secondary | ICD-10-CM

## 2024-10-05 ENCOUNTER — Telehealth: Admitting: Nurse Practitioner

## 2024-10-05 NOTE — Procedures (Signed)
   History:  44 year old woman with paroxysmal events concerning for seizure  EEG classification:  Awake and asleep  Duration: 29 minutes   Technical aspects: This EEG study was done with scalp electrodes positioned according to the 10-20 International system of electrode placement. Electrical activity was reviewed with band pass filter of 1-70Hz , sensitivity of 7 uV/mm, display speed of 64mm/sec with a 60Hz  notched filter applied as appropriate. EEG data were recorded continuously and digitally stored.   Description of the recording: The background rhythms of this recording consists of a fairly well modulated medium amplitude background activity of 10 Hz. As the record progresses, the patient initially is in the waking state, but appears to enter the early stage II sleep during the recording, with rudimentary sleep spindles and vertex sharp wave activity seen. During the wakeful state, photic stimulation was performed, and no abnormal responses were seen. Hyperventilation was also performed, no abnormal response seen. No epileptiform discharges seen during this recording. There was no focal slowing.   Abnormality: None   Impression: This is a normal awake and sleep EEG. No evidence of interictal epileptiform discharges. Normal EEGs, however, do not rule out epilepsy.    Samanyu Tinnell, MD [facility]
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
## 2024-10-07 ENCOUNTER — Ambulatory Visit: Payer: Self-pay | Admitting: Neurology

## 2024-10-07 ENCOUNTER — Encounter: Payer: Self-pay | Admitting: Nurse Practitioner

## 2024-10-07 ENCOUNTER — Other Ambulatory Visit: Payer: Self-pay | Admitting: Neurology

## 2024-10-07 NOTE — Progress Notes (Signed)
 EEG is normal. Will discontinue Lamotrigine  and patient will take Topiramate (mainly for weight management)

## 2024-10-27 ENCOUNTER — Ambulatory Visit (HOSPITAL_COMMUNITY)
Admission: RE | Admit: 2024-10-27 | Discharge: 2024-10-27 | Disposition: A | Discharge status: Home / Self Care | Source: Ambulatory Visit | Attending: Cardiovascular Disease | Admitting: Cardiovascular Disease

## 2024-10-27 ENCOUNTER — Encounter: Payer: Self-pay | Admitting: Cardiovascular Disease

## 2024-10-27 DIAGNOSIS — I471 Supraventricular tachycardia, unspecified: Secondary | ICD-10-CM | POA: Diagnosis not present

## 2024-10-27 LAB — ECHOCARDIOGRAM COMPLETE
Area-P 1/2: 2.99 cm2
MV M vel: 5.92 m/s
MV Peak grad: 139.9 mmHg
S' Lateral: 2.9 cm

## 2024-11-01 ENCOUNTER — Ambulatory Visit: Payer: Self-pay | Admitting: Cardiovascular Disease

## 2024-11-04 ENCOUNTER — Encounter: Payer: Self-pay | Admitting: Nurse Practitioner

## 2024-11-04 ENCOUNTER — Telehealth: Payer: Self-pay | Admitting: Nurse Practitioner

## 2024-11-04 VITALS — Ht 67.0 in | Wt 216.5 lb

## 2024-11-04 DIAGNOSIS — F419 Anxiety disorder, unspecified: Secondary | ICD-10-CM

## 2024-11-04 NOTE — Assessment & Plan Note (Signed)
 SABRA

## 2024-11-04 NOTE — Progress Notes (Unsigned)
" ° °  Subjective:  No chief complaint on file.    HPI: Natalie Colon is a 44 y.o. female presenting on 11/04/2024 with report of ***    ROS: Negative unless specifically indicated above in HPI.   Relevant past medical history reviewed and updated as indicated.   Allergies and medications reviewed and updated.   Current Outpatient Medications  Medication Instructions   cetirizine  (ZYRTEC  ALLERGY) 10 mg, Oral, Daily   escitalopram (LEXAPRO) 10 mg, Daily   fluticasone  (FLONASE ) 50 MCG/ACT nasal spray 1 spray, Daily   fluticasone  (FLOVENT  HFA) 220 MCG/ACT inhaler 2 puffs, Inhalation, Every 12 hours   hydrOXYzine  (ATARAX ) 25 mg, Daily   metoprolol  succinate (TOPROL  XL) 25 mg, Oral, Daily   valACYclovir (VALTREX) 500 mg, Daily   VENTOLIN  HFA 108 (90 Base) MCG/ACT inhaler 1-2 puffs, Inhalation, Every 4 hours PRN     Allergies[1]  Objective:   Ht 5' 7 (1.702 m)   Wt 216 lb 8 oz (98.2 kg)   BMI 33.91 kg/m    Physical Exam Constitutional:      Appearance: Normal appearance.  HENT:     Head: Normocephalic.  Eyes:     Conjunctiva/sclera: Conjunctivae normal.  Cardiovascular:     Rate and Rhythm: Normal rate and regular rhythm.  Pulmonary:     Effort: Pulmonary effort is normal.     Breath sounds: Normal breath sounds.  Skin:    General: Skin is warm and dry.  Neurological:     General: No focal deficit present.     Mental Status: She is alert and oriented to person, place, and time.  Psychiatric:        Mood and Affect: Mood normal.        Behavior: Behavior normal.        Thought Content: Thought content normal.        Judgment: Judgment normal.      Assessment & Plan:   Assessment & Plan Anxiety      Follow up plan: Return in about 4 weeks (around 12/02/2024) for Medication Follow-up.  [name], NP       [1] Allergies Allergen Reactions   Bee Venom    Latex Itching   Penicillins Other (See Comments)    unknown   Vicodin  [Hydrocodone-Acetaminophen ] Other (See Comments)    Heart racing   Hydrocodone-Acetaminophen  Palpitations  "

## 2024-12-02 ENCOUNTER — Telehealth: Admitting: Nurse Practitioner

## 2024-12-02 DIAGNOSIS — F419 Anxiety disorder, unspecified: Secondary | ICD-10-CM

## 2024-12-02 NOTE — Progress Notes (Unsigned)
" ° °  Subjective:  I'm very stressed.    HPI: Natalie Colon is a 44 y.o. female presenting on 12/02/2024 via telehealth for medication follow up.  Patient reports things are very stressful. She has a lot of external factors causing her stress. She has a daughter who will be going off to college in the fall.     ROS: Negative unless specifically indicated above in HPI.   Relevant past medical history reviewed and updated as indicated.   Allergies and medications reviewed and updated.   Current Outpatient Medications  Medication Instructions   cetirizine  (ZYRTEC  ALLERGY) 10 mg, Oral, Daily   escitalopram (LEXAPRO) 10 mg, Daily   fluticasone  (FLONASE ) 50 MCG/ACT nasal spray 1 spray, Daily   fluticasone  (FLOVENT  HFA) 220 MCG/ACT inhaler 2 puffs, Inhalation, Every 12 hours   hydrOXYzine  (ATARAX ) 25 mg, Daily   metoprolol  succinate (TOPROL  XL) 25 mg, Oral, Daily   valACYclovir (VALTREX) 500 mg, Daily   VENTOLIN  HFA 108 (90 Base) MCG/ACT inhaler 1-2 puffs, Inhalation, Every 4 hours PRN     Allergies[1]  Objective:   There were no vitals taken for this visit.   Physical Exam Constitutional:      Appearance: Normal appearance.  HENT:     Head: Normocephalic.  Eyes:     Conjunctiva/sclera: Conjunctivae normal.  Neurological:     General: No focal deficit present.     Mental Status: She is alert and oriented to person, place, and time.  Psychiatric:        Mood and Affect: Mood normal.        Behavior: Behavior normal.        Thought Content: Thought content normal.        Judgment: Judgment normal.        Assessment & Plan:   Assessment & Plan Anxiety        Follow up plan: No follow-ups on file.  [name], NP       [1] Allergies Allergen Reactions   Bee Venom    Latex Itching   Penicillins Other (See Comments)    unknown   Vicodin [Hydrocodone-Acetaminophen ] Other (See Comments)    Heart racing   Hydrocodone-Acetaminophen   Palpitations  "

## 2024-12-02 NOTE — Assessment & Plan Note (Signed)
 SABRA

## 2024-12-28 ENCOUNTER — Telehealth: Admitting: Nurse Practitioner

## 2025-05-02 ENCOUNTER — Ambulatory Visit: Admitting: Neurology
